# Patient Record
Sex: Male | Born: 1975
Health system: Southern US, Community
[De-identification: ages and names within clinical notes are randomized; demographics above are authoritative.]

## PROBLEM LIST (undated history)

## (undated) DIAGNOSIS — K219 Gastro-esophageal reflux disease without esophagitis: Secondary | ICD-10-CM

## (undated) DIAGNOSIS — R011 Cardiac murmur, unspecified: Secondary | ICD-10-CM

## (undated) HISTORY — DX: Gastro-esophageal reflux disease without esophagitis: K21.9

## (undated) HISTORY — DX: Cardiac murmur, unspecified: R01.1

---
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
## 2014-02-24 ENCOUNTER — Ambulatory Visit (INDEPENDENT_AMBULATORY_CARE_PROVIDER_SITE_OTHER): Payer: BC Managed Care – PPO | Admitting: Emergency Medicine

## 2014-02-24 VITALS — BP 128/98 | HR 77 | Temp 98.3°F | Resp 18 | Wt 156.0 lb

## 2014-02-24 DIAGNOSIS — B353 Tinea pedis: Secondary | ICD-10-CM

## 2014-02-24 MED ORDER — TERBINAFINE HCL 250 MG PO TABS: 250.0000 mg | ORAL_TABLET | Freq: Every day | ORAL | Status: DC

## 2014-02-24 NOTE — Progress Notes (Signed)
[facility] [address], [place] [address] [phone]  Date:  02/24/2014   Name:  Nathan PimpleSteven Mccall   DOB:  Jan 28, 1976   MRN:  [id number]  PCP:  No primary provider on file.    Chief Complaint: Tinea Pedis   History of Present Illness:  Nathan Mccall is a 38 y.o. very pleasant male patient who presents with the following:  Has red itchy eruption on both feet and ankles.  History of athletes foot.  Wears coots with cotton socks.  No improvement with over the counter medications or other home remedies. Denies other complaint or health concern today.  There are no active problems to display for this patient.   No past medical history on file.  No past surgical history on file.  History  Substance Use Topics  . Smoking status: Former Games developermoker  . Smokeless tobacco: Not on file  . Alcohol Use: Yes    Family History  Problem Relation Age of Onset  . Diabetes Mother     No Known Allergies  Medication list has been reviewed and updated.  No current outpatient prescriptions on file prior to visit.   No current facility-administered medications on file prior to visit.    Review of Systems:  As per HPI, otherwise negative.    Physical Examination: Filed Vitals:   02/24/14 1920  BP: 128/98  Pulse: 77  Temp: 98.3 F (36.8 C)  Resp: 18   Filed Vitals:   02/24/14 1920  Weight: 156 lb (70.761 kg)   There is no height on file to calculate BMI. Ideal Body Weight:     GEN: WDWN, NAD, Non-toxic, Alert & Oriented x 3 HEENT: Atraumatic, Normocephalic.  Ears and Nose: No external deformity. EXTR: No clubbing/cyanosis/edema NEURO: Normal gait.  PSYCH: Normally interactive. Conversant. Not depressed or anxious appearing.  Calm demeanor.  FEET:  Erythematous roundish smooth border lesions on feet and toes  Assessment and Plan: Tinea pedis Powder No cotton socks lamisil  Signed,  Phillips OdorJeffery Ayesha Markwell, MD

## 2014-02-24 NOTE — Patient Instructions (Signed)

## 2014-03-08 ENCOUNTER — Other Ambulatory Visit: Payer: Self-pay | Admitting: Emergency Medicine

## 2014-03-08 NOTE — Telephone Encounter (Signed)
Do you want to RF this? 

## 2015-03-13 ENCOUNTER — Ambulatory Visit (INDEPENDENT_AMBULATORY_CARE_PROVIDER_SITE_OTHER): Payer: 59 | Admitting: Emergency Medicine

## 2015-03-13 ENCOUNTER — Ambulatory Visit (INDEPENDENT_AMBULATORY_CARE_PROVIDER_SITE_OTHER): Payer: 59

## 2015-03-13 VITALS — BP 124/76 | HR 81 | Temp 98.0°F | Resp 17 | Ht 69.0 in | Wt 160.0 lb

## 2015-03-13 DIAGNOSIS — J341 Cyst and mucocele of nose and nasal sinus: Secondary | ICD-10-CM | POA: Diagnosis not present

## 2015-03-13 DIAGNOSIS — R1013 Epigastric pain: Secondary | ICD-10-CM

## 2015-03-13 LAB — COMPLETE METABOLIC PANEL WITH GFR
ALBUMIN: 4.7 g/dL (ref 3.5–5.2)
ALT: 29 U/L (ref 0–53)
AST: 18 U/L (ref 0–37)
Alkaline Phosphatase: 70 U/L (ref 39–117)
BUN: 9 mg/dL (ref 6–23)
CHLORIDE: 106 meq/L (ref 96–112)
CO2: 26 meq/L (ref 19–32)
Calcium: 9.5 mg/dL (ref 8.4–10.5)
Creat: 0.74 mg/dL (ref 0.50–1.35)
GFR, Est African American: >89 mL/min
Glucose, Bld: 91 mg/dL (ref 70–99)
Potassium: 4.4 mEq/L (ref 3.5–5.3)
Sodium: 140 mEq/L (ref 135–145)
Total Bilirubin: 0.6 mg/dL (ref 0.2–1.2)
Total Protein: 6.8 g/dL (ref 6.0–8.3)

## 2015-03-13 LAB — POCT CBC
GRANULOCYTE PERCENT: 60.5 % (ref 37–80)
HCT, POC: 47.2 % (ref 43.5–53.7)
Hemoglobin: 16.2 g/dL (ref 14.1–18.1)
Lymph, poc: 2.6 (ref 0.6–3.4)
MCH, POC: 30.6 pg (ref 27–31.2)
MCHC: 34.3 g/dL (ref 31.8–35.4)
MCV: 89.2 fL (ref 80–97)
MID (CBC): 0.5 (ref 0–0.9)
MPV: 7.8 fL (ref 0–99.8)
PLATELET COUNT, POC: 293 10*3/uL (ref 142–424)
POC Granulocyte: 4.8 (ref 2–6.9)
POC LYMPH PERCENT: 32.8 %L (ref 10–50)
POC MID %: 6.7 %M (ref 0–12)
RBC: 5.29 M/uL (ref 4.69–6.13)
RDW, POC: 13.8 %
WBC: 8 10*3/uL (ref 4.6–10.2)

## 2015-03-13 MED ORDER — OMEPRAZOLE 40 MG PO CPDR: 40.0000 mg | DELAYED_RELEASE_CAPSULE | Freq: Every day | ORAL | Status: DC

## 2015-03-13 MED ORDER — FLUTICASONE PROPIONATE 50 MCG/ACT NA SUSP: 2.0000 | Freq: Every day | NASAL | Status: DC

## 2015-03-13 NOTE — Patient Instructions (Signed)
Gastroesophageal Reflux Disease, Adult Gastroesophageal reflux disease (GERD) happens when acid from your stomach flows up into the esophagus. When acid comes in contact with the esophagus, the acid causes soreness (inflammation) in the esophagus. Over time, GERD may create small holes (ulcers) in the lining of the esophagus. CAUSES   Increased body weight. This puts pressure on the stomach, making acid rise from the stomach into the esophagus.  Smoking. This increases acid production in the stomach.  Drinking alcohol. This causes decreased pressure in the lower esophageal sphincter (valve or ring of muscle between the esophagus and stomach), allowing acid from the stomach into the esophagus.  Late evening meals and a full stomach. This increases pressure and acid production in the stomach.  A malformed lower esophageal sphincter. Sometimes, no cause is found. SYMPTOMS   Burning pain in the lower part of the mid-chest behind the breastbone and in the mid-stomach area. This may occur twice a week or more often.  Trouble swallowing.  Sore throat.  Dry cough.  Asthma-like symptoms including chest tightness, shortness of breath, or wheezing. DIAGNOSIS  Your caregiver may be able to diagnose GERD based on your symptoms. In some cases, X-rays and other tests may be done to check for complications or to check the condition of your stomach and esophagus. TREATMENT  Your caregiver may recommend over-the-counter or prescription medicines to help decrease acid production. Ask your caregiver before starting or adding any new medicines.  HOME CARE INSTRUCTIONS   Change the factors that you can control. Ask your caregiver for guidance concerning weight loss, quitting smoking, and alcohol consumption.  Avoid foods and drinks that make your symptoms worse, such as:  Caffeine or alcoholic drinks.  Chocolate.  Peppermint or mint flavorings.  Garlic and onions.  Spicy foods.  Citrus fruits,  such as oranges, lemons, or limes.  Tomato-based foods such as sauce, chili, salsa, and pizza.  Fried and fatty foods.  Avoid lying down for the 3 hours prior to your bedtime or prior to taking a nap.  Eat small, frequent meals instead of large meals.  Wear loose-fitting clothing. Do not wear anything tight around your waist that causes pressure on your stomach.  Raise the head of your bed 6 to 8 inches with wood blocks to help you sleep. Extra pillows will not help.  Only take over-the-counter or prescription medicines for pain, discomfort, or fever as directed by your caregiver.  Do not take aspirin, ibuprofen, or other nonsteroidal anti-inflammatory drugs (NSAIDs). SEEK IMMEDIATE MEDICAL CARE IF:   You have pain in your arms, neck, jaw, teeth, or back.  Your pain increases or changes in intensity or duration.  You develop nausea, vomiting, or sweating (diaphoresis).  You develop shortness of breath, or you faint.  Your vomit is green, yellow, black, or looks like coffee grounds or blood.  Your stool is red, bloody, or black. These symptoms could be signs of other problems, such as heart disease, gastric bleeding, or esophageal bleeding. MAKE SURE YOU:   Understand these instructions.  Will watch your condition.  Will get help right away if you are not doing well or get worse. Document Released: 05/16/2005 Document Revised: 10/29/2011 Document Reviewed: 02/23/2011 ExitCare Patient Information 2015 [employer]. This information is not intended to replace advice given to you by your health care provider. Make sure you discuss any questions you have with your health care provider.  

## 2015-03-13 NOTE — Progress Notes (Addendum)
Patient ID: Nathan Mccall, male   DOB: Jul 26, 1976, 39 y.o.   MRN: [id number]    This chart was scribed for Nena Jordan, MD by [facility], medical scribe at [facility].The patient was seen in exam room 10 and the patient's care was started at 10:25 AM.  Chief Complaint:  Chief Complaint  Patient presents with  . cyst in nose per patient  . heartburn   HPI: Nathan Mccall is a 39 y.o. male who reports to [facility] today complaining of a possible cyst in his right nares. He first noticed this three months. There is no pain, tenderness, or discomfort associated with the cyst.  He also complains of constant heartburn. He takes OTC nexium, zantac daily which does help. Taking these medications daily. Unsure if its related to what he eats.  He is a Dealer.  No past medical history on file. No past surgical history on file. History   Social History  . Marital Status: Single    Spouse Name: N/A  . Number of Children: N/A  . Years of Education: N/A   Social History Main Topics  . Smoking status: Former Research scientist (life sciences)  . Smokeless tobacco: Not on file  . Alcohol Use: Yes  . Drug Use: No  . Sexual Activity: Not on file   Other Topics Concern  . None   Social History Narrative   Family History  Problem Relation Age of Onset  . Diabetes Mother    No Known Allergies Prior to Admission medications   Medication Sig Start Date End Date Taking? Authorizing Provider  terbinafine (LAMISIL) 250 MG tablet TAKE 1 TABLET (250 MG TOTAL) BY MOUTH DAILY. Patient not taking: Reported on 03/13/2015 03/08/14   Roselee Culver, MD   ROS: The patient denies fevers, chills, night sweats, unintentional weight loss, chest pain, palpitations, wheezing, dyspnea on exertion, nausea, vomiting, abdominal pain, dysuria, hematuria, melena, numbness, weakness, or tingling.   All other systems have been reviewed and were otherwise negative with the exception of those mentioned in the HPI and  as above.    PHYSICAL EXAM: Filed Vitals:   03/13/15 0945  BP: 124/76  Pulse: 81  Temp: 98 F (36.7 C)  Resp: 17   Body mass index is 23.62 kg/(m^2).  General: Alert, no acute distress HEENT:  Normocephalic, atraumatic, oropharynx patent. Ears a normal, left nares is normal. Cystic area in the right nasal pharynx not typical of a nasal polyp. Psychiatric: Patient acts appropriately throughout our interaction.  LABS: No results found for this or any previous visit.  EKG/XRAY:   Primary read interpreted by Dr. Everlene Farrier at [facility]. No abnormality seen.  ASSESSMENT/PLAN: Patient placed on omeprazole 40 for 1 month and recheck for his dyspepsia. I did do before meals met. He will be on Flonase and referral made to ENT for their evaluation.I personally performed the services described in this documentation, which was scribed in my presence. The recorded information has been reviewed and is accurate.  Nena Jordan, MD  Gross sideeffects, risk and benefits, and alternatives of medications d/w patient. Patient is aware that all medications have potential sideeffects and we are unable to predict every sideeffect or drug-drug interaction that may occur.    Arlyss Queen MD 03/13/2015 10:25 AM

## 2015-05-02 ENCOUNTER — Ambulatory Visit (INDEPENDENT_AMBULATORY_CARE_PROVIDER_SITE_OTHER): Payer: 59 | Admitting: Emergency Medicine

## 2015-05-02 VITALS — BP 108/68 | HR 52 | Temp 98.0°F | Resp 16 | Ht 66.0 in | Wt 162.0 lb

## 2015-05-02 DIAGNOSIS — S161XXA Strain of muscle, fascia and tendon at neck level, initial encounter: Secondary | ICD-10-CM | POA: Diagnosis not present

## 2015-05-02 MED ORDER — TRAMADOL HCL 50 MG PO TABS: 50.0000 mg | ORAL_TABLET | Freq: Three times a day (TID) | ORAL | Status: DC | PRN

## 2015-05-02 MED ORDER — CYCLOBENZAPRINE HCL 10 MG PO TABS: 10.0000 mg | ORAL_TABLET | Freq: Three times a day (TID) | ORAL | Status: DC | PRN

## 2015-05-02 MED ORDER — NAPROXEN SODIUM 550 MG PO TABS: 550.0000 mg | ORAL_TABLET | Freq: Two times a day (BID) | ORAL | Status: AC

## 2015-05-02 NOTE — Patient Instructions (Signed)

## 2015-05-02 NOTE — Progress Notes (Signed)
Subjective:  Patient ID: Nathan Mccall, male    DOB: [birth year]  Age: 39 y.o. MRN: [id number]  CC: Neck Pain and Does not want flu shot   HPI Nathan Mccall presents  with pain in his base of his right neck after lifting a 50 pound bag of corn yesterday. No radicular symptoms. No radiation of pain numbness tingling or weakness. He has no direct injury. He has a history of prior cervical strain. He's had no improvement is pain with over-the-counter medication or local application of heat  History Nathan Mccall has no past medical history on file.   He has no past surgical history on file.   His  family history includes Diabetes in his mother.  He   reports that he has quit smoking. He does not have any smokeless tobacco history on file. He reports that he drinks alcohol. He reports that he does not use illicit drugs.  Outpatient Prescriptions Prior to Visit  Medication Sig Dispense Refill  . omeprazole (PRILOSEC) 40 MG capsule Take 1 capsule (40 mg total) by mouth daily. 30 capsule 1  . fluticasone (FLONASE) 50 MCG/ACT nasal spray Place 2 sprays into both nostrils daily. (Patient not taking: Reported on 05/02/2015) 16 g 6  . terbinafine (LAMISIL) 250 MG tablet TAKE 1 TABLET (250 MG TOTAL) BY MOUTH DAILY. (Patient not taking: Reported on 03/13/2015) 15 tablet 0   No facility-administered medications prior to visit.    Social History   Social History  . Marital Status: Single    Spouse Name: N/A  . Number of Children: N/A  . Years of Education: N/A   Social History Main Topics  . Smoking status: Former Games developer  . Smokeless tobacco: None  . Alcohol Use: Yes  . Drug Use: No  . Sexual Activity: Not Asked   Other Topics Concern  . None   Social History Narrative     Review of Systems  Constitutional: Negative for fever, chills and appetite change.  HENT: Negative for congestion, ear pain, postnasal drip, sinus pressure and sore throat.   Eyes: Negative for pain and  redness.  Respiratory: Negative for cough, shortness of breath and wheezing.   Cardiovascular: Negative for leg swelling.  Gastrointestinal: Negative for nausea, vomiting, abdominal pain, diarrhea, constipation and blood in stool.  Endocrine: Negative for polyuria.  Genitourinary: Negative for dysuria, urgency, frequency and flank pain.  Musculoskeletal: Negative for gait problem.  Skin: Negative for rash.  Neurological: Negative for weakness and headaches.  Psychiatric/Behavioral: Negative for confusion and decreased concentration. The patient is not nervous/anxious.     Objective:  BP 108/68 mmHg  Pulse 52  Temp(Src) 98 F (36.7 C) (Oral)  Resp 16  Ht  (1.676 m)  Wt 162 lb (73.483 kg)  BMI 26.16 kg/m2  SpO2 98%  Physical Exam  Constitutional: He is oriented to person, place, and time. He appears well-developed and well-nourished.  HENT:  Head: Normocephalic and atraumatic.  Eyes: Conjunctivae are normal. Pupils are equal, round, and reactive to light.  Pulmonary/Chest: Effort normal.  Musculoskeletal: He exhibits no edema.       Right shoulder: He exhibits tenderness and spasm.       Cervical back: He exhibits decreased range of motion and tenderness.  Neurological: He is alert and oriented to person, place, and time.  Skin: Skin is dry.  Psychiatric: He has a normal mood and affect. His behavior is normal. Thought content normal.      Assessment & Plan:  Nathan Mccall was seen today for neck pain and does not want flu shot.  Diagnoses and all orders for this visit:  Cervical strain, initial encounter  Other orders -     naproxen sodium (ANAPROX DS) 550 MG tablet; Take 1 tablet (550 mg total) by mouth 2 (two) times daily with a meal. -     cyclobenzaprine (FLEXERIL) 10 MG tablet; Take 1 tablet (10 mg total) by mouth 3 (three) times daily as needed for muscle spasms. -     traMADol (ULTRAM) 50 MG tablet; Take 1 tablet (50 mg total) by mouth every 8 (eight) hours as  needed.   I have discontinued Mr. Szczepanik terbinafine and fluticasone. I am also having him start on naproxen sodium, cyclobenzaprine, and traMADol. Additionally, I am having him maintain his omeprazole and ibuprofen.  Meds ordered this encounter  Medications  . ibuprofen (ADVIL,MOTRIN) 200 MG tablet    Sig: Take 200 mg by mouth every 6 (six) hours as needed for moderate pain.  . naproxen sodium (ANAPROX DS) 550 MG tablet    Sig: Take 1 tablet (550 mg total) by mouth 2 (two) times daily with a meal.    Dispense:  40 tablet    Refill:  0  . cyclobenzaprine (FLEXERIL) 10 MG tablet    Sig: Take 1 tablet (10 mg total) by mouth 3 (three) times daily as needed for muscle spasms.    Dispense:  30 tablet    Refill:  0  . traMADol (ULTRAM) 50 MG tablet    Sig: Take 1 tablet (50 mg total) by mouth every 8 (eight) hours as needed.    Dispense:  45 tablet    Refill:  0    Appropriate red flag conditions were discussed with the patient as well as actions that should be taken.  Patient expressed his understanding.  Follow-up: Return if symptoms worsen or fail to improve.  Carmelina Dane, MD

## 2015-05-26 ENCOUNTER — Other Ambulatory Visit: Payer: Self-pay | Admitting: Emergency Medicine

## 2015-11-03 ENCOUNTER — Other Ambulatory Visit: Payer: Self-pay | Admitting: Emergency Medicine

## 2016-01-06 ENCOUNTER — Other Ambulatory Visit: Payer: Self-pay | Admitting: Emergency Medicine

## 2016-04-12 ENCOUNTER — Ambulatory Visit: Payer: Self-pay | Admitting: Internal Medicine

## 2016-06-16 IMAGING — CR DG SINUSES COMPLETE 3+V
5 series · 5 of 5 positions shown · non-contrast
Comparison: None.

CLINICAL DATA: Cyst of right nasal cavity.

EXAM:
PARANASAL SINUSES - COMPLETE 3 + VIEW

[PA]
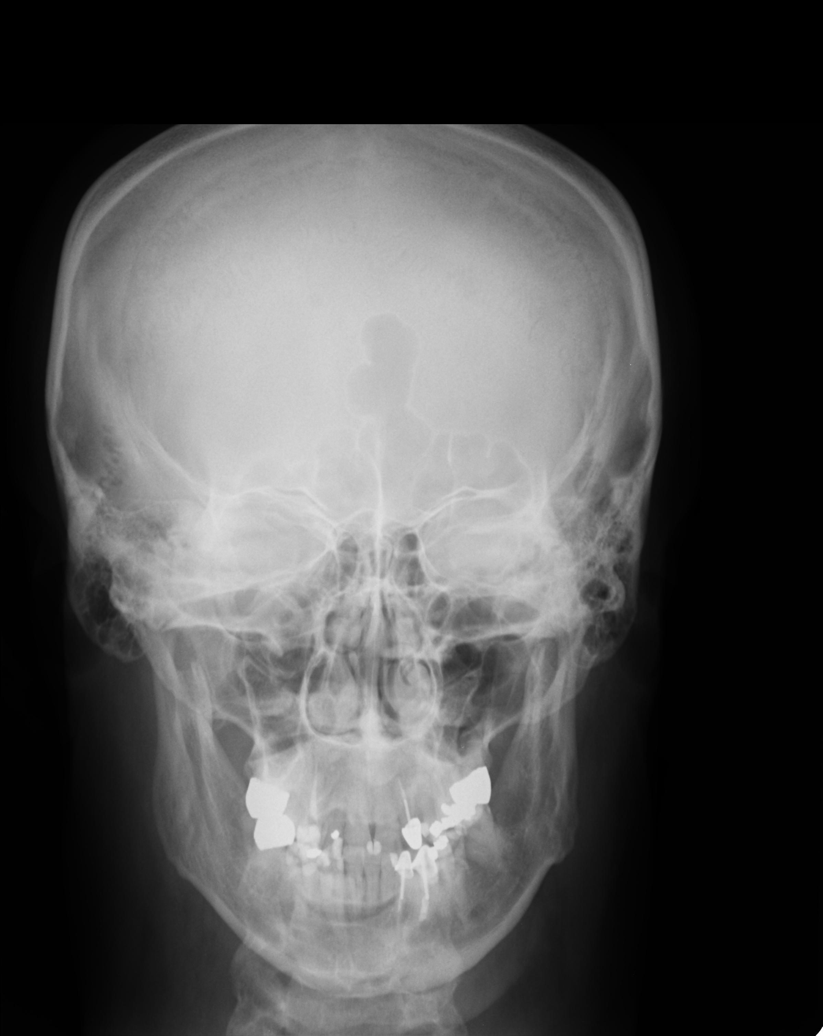

[waters]
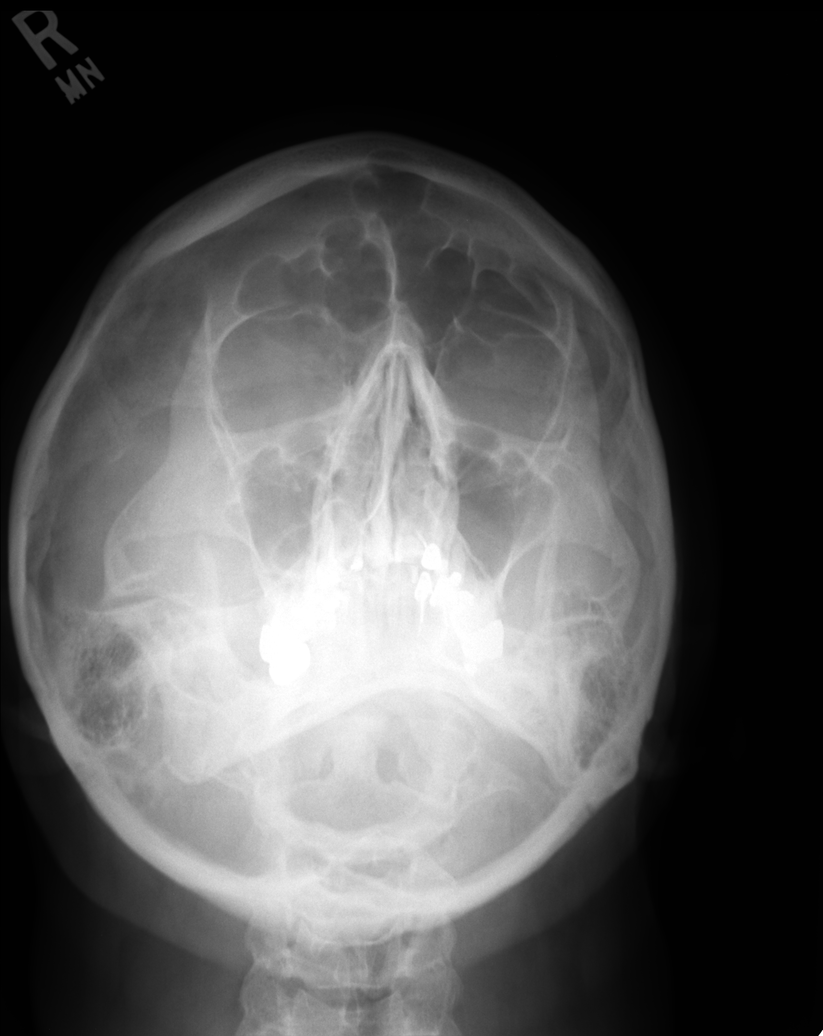

[lateral]
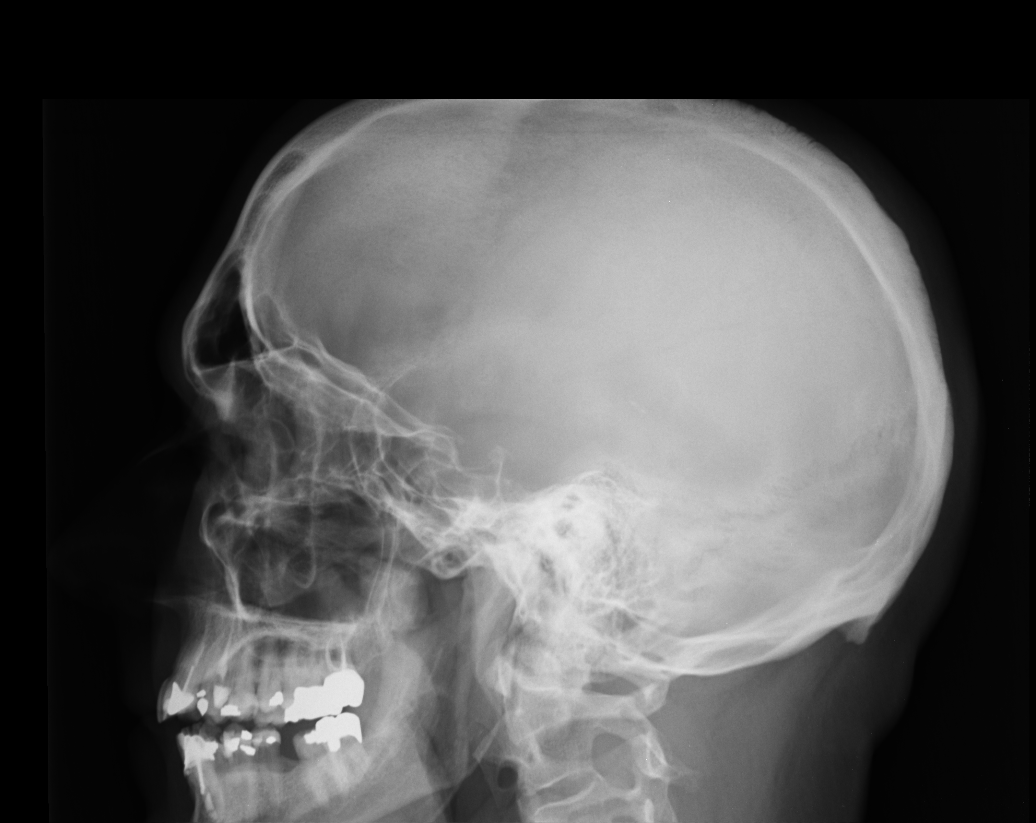

[smv (1 of 2)]
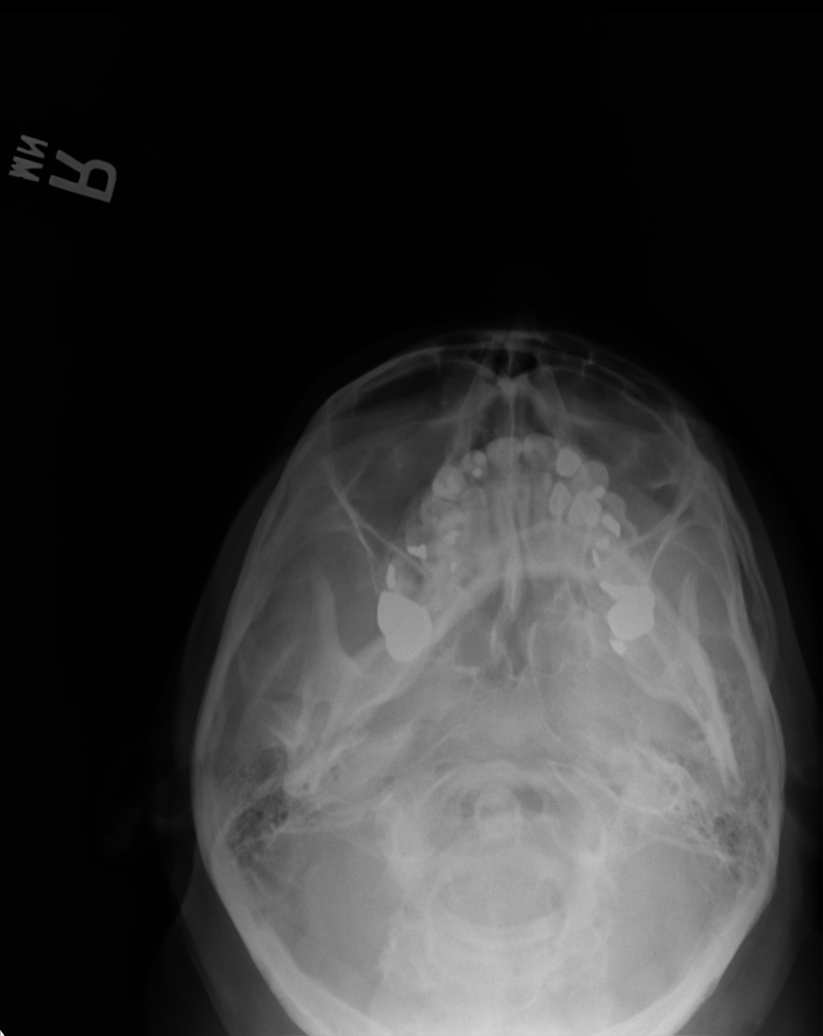

[smv (2 of 2)]
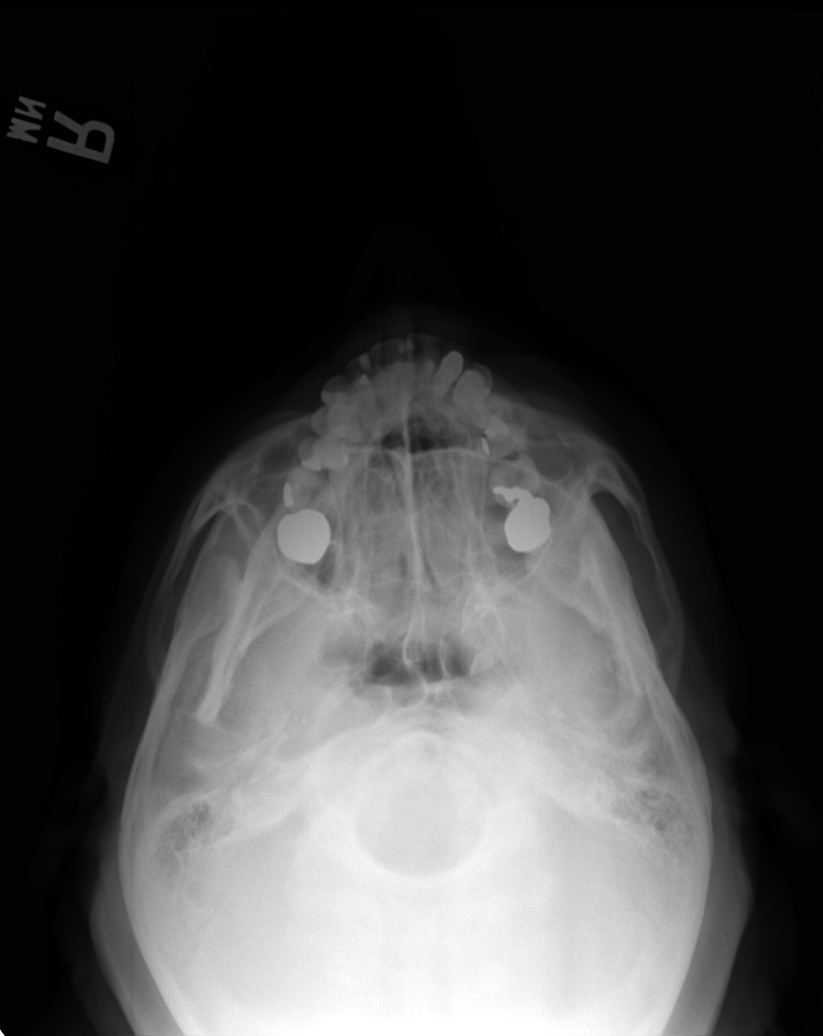

[5 of 5 positions shown; findings below may reference images not displayed]

FINDINGS: The paranasal sinus are aerated. There is no evidence of sinus
opacification air-fluid levels or mucosal thickening. No significant
bone abnormalities are seen.
IMPRESSION: Negative.

## 2016-08-04 DIAGNOSIS — H02843 Edema of right eye, unspecified eyelid: Secondary | ICD-10-CM | POA: Diagnosis not present

## 2016-08-04 DIAGNOSIS — H109 Unspecified conjunctivitis: Secondary | ICD-10-CM | POA: Diagnosis not present

## 2017-01-03 ENCOUNTER — Encounter: Payer: Self-pay | Admitting: Urology

## 2017-01-03 ENCOUNTER — Ambulatory Visit: Payer: BLUE CROSS/BLUE SHIELD | Admitting: Urology

## 2017-01-03 VITALS — BP 132/78 | HR 99 | Ht 70.0 in | Wt 155.0 lb

## 2017-01-03 DIAGNOSIS — Z3009 Encounter for other general counseling and advice on contraception: Secondary | ICD-10-CM | POA: Diagnosis not present

## 2017-01-03 MED ORDER — DIAZEPAM 10 MG PO TABS: 10.0000 mg | ORAL_TABLET | Freq: Once | ORAL | 0 refills | Status: AC

## 2017-01-03 NOTE — Progress Notes (Signed)
01/03/2017 10:54 AM   Nathan Mccall 16-Aug-1976 [id number]  Referring provider: Peyton Najjar, MD [address] [place], [place] [address]  CC: Vasectomy consult  HPI: The patient is a 41 year old gentleman who presents today for discussion regarding a vasectomy. The patient has 3 children. He and his wife desire no further children. He has no genitourinary problems. He has no voiding dysfunction. Denies history of hematuria, UTI, nephrolithiasis.     PMH: Past Medical History:  Diagnosis Date  . GERD (gastroesophageal reflux disease)   . Heart murmur     Surgical History: Past Surgical History:  Procedure Laterality Date  . APPENDECTOMY  1987    Home Medications:  Allergies as of 01/03/2017   No Known Allergies     Medication List       Accurate as of 01/03/17 10:54 AM. Always use your most recent med list.          diazepam 10 MG tablet Commonly known as:  VALIUM Take 1 tablet (10 mg total) by mouth once.       Allergies: No Known Allergies  Family History: Family History  Problem Relation Age of Onset  . Diabetes Mother     Social History:  reports that he has quit smoking. He has never used smokeless tobacco. He reports that he drinks alcohol. He reports that he does not use drugs.  ROS: UROLOGY Frequent Urination?: No Hard to postpone urination?: No Burning/pain with urination?: No Get up at night to urinate?: No Leakage of urine?: No Urine stream starts and stops?: No Trouble starting stream?: No Do you have to strain to urinate?: No Blood in urine?: No Urinary tract infection?: No Sexually transmitted disease?: No Injury to kidneys or bladder?: No Painful intercourse?: No Weak stream?: No Erection problems?: No Penile pain?: No  Gastrointestinal Nausea?: No Vomiting?: No Indigestion/heartburn?: Yes Diarrhea?: No Constipation?: No  Constitutional Fever: No Night sweats?: No Weight loss?: No Fatigue?: No  Skin Skin  rash/lesions?: No Itching?: No  Eyes Blurred vision?: No Double vision?: No  Ears/Nose/Throat Sore throat?: No Sinus problems?: No  Hematologic/Lymphatic Swollen glands?: No Easy bruising?: No  Cardiovascular Leg swelling?: No Chest pain?: No  Respiratory Cough?: No Shortness of breath?: No  Endocrine Excessive thirst?: No  Musculoskeletal Back pain?: No Joint pain?: No  Neurological Headaches?: No Dizziness?: No  Psychologic Depression?: No Anxiety?: No  Physical Exam: BP 132/78   Pulse 99   Ht 5\' 10"  (1.778 m)   Wt 155 lb (70.3 kg)   BMI 22.24 kg/m   Constitutional:  Alert and oriented, No acute distress. HEENT: [place] AT, moist mucus membranes.  Trachea midline, no masses. Cardiovascular: No clubbing, cyanosis, or edema. Respiratory: Normal respiratory effort, no increased work of breathing. GI: Abdomen is soft, nontender, nondistended, no abdominal masses GU: No CVA tenderness. Normal phallus. Testicles descended bilaterally. No masses. Bilateral vas deferens palpable easily bilaterally. Skin: No rashes, bruises or suspicious lesions. Lymph: No cervical or inguinal adenopathy. Neurologic: Grossly intact, no focal deficits, moving all 4 extremities. Psychiatric: Normal mood and affect.  Laboratory Data: Lab Results  Component Value Date   WBC 8.0 03/13/2015   HGB 16.2 03/13/2015   HCT 47.2 03/13/2015   MCV 89.2 03/13/2015    Lab Results  Component Value Date   CREATININE 0.74 03/13/2015    No results found for: PSA  No results found for: TESTOSTERONE  No results found for: HGBA1C  Urinalysis No results found for: COLORURINE, APPEARANCEUR, LABSPEC, PHURINE,  GLUCOSEU, HGBUR, BILIRUBINUR, KETONESUR, PROTEINUR, UROBILINOGEN, NITRITE, LEUKOCYTESUR   Assessment & Plan:    Today, we discussed what the vas deferens is, where it is located, and its function. We reviewed the procedure for vasectomy, it's risks, benefits, alternatives, and  likelihood of achieving his goals. We discussed in detail the procedure, complications, and recovery as well as the need for clearance prior to unprotected intercourse. We discussed that vasectomy does not protect against sexually transmitted diseases. We discussed that this procedure does not result in immediate sterility and that they would need to use other forms of birth control until he has been cleared with negative postvasectomy semen analyses. I explained that the procedure is considered to be permanent and that attempts at reversal have varying degrees of success. These options include vasectomy reversal, sperm retrieval, and in vitro fertilization; these can be very expensive. We discussed the chance of postvasectomy pain syndrome which occurs in less than 5% of patients. I explained to the patient that there is no treatment to resolve this chronic pain, and that if it developed I would not be able to help resolve the issue, but that surgery is generally not needed for correction. I explained there have even been reports of systemic like illness associated with this chronic pain, and that there was no good cure. I explained that vasectomy it is not a 100% reliable form of birth control, and the risk of pregnancy after vasectomy is approximately 1 in 2000 men who had a negative postvasectomy semen analysis or rare non-motile sperm. I explained that repeat vasectomy was necessary in less than 1% of vasectomy procedures when employing the type of technique that I use. I explained that he should refrain from ejaculation for approximately one week following vasectomy. I explained that there are other options for birth control which are permanent and non-permanent; we discussed these. I explained the rates of surgical complications, such as symptomatic hematoma or infection, are low (1-2%) and vary with the surgeon's experience and criteria used to diagnose the complication.  The patient had the opportunity to  ask questions to his stated satisfaction. He voiced understanding of the above factors and stated that he has read all the information provided to him and the packets and informed consent  1. Family planning -vasecomty  Return for vasectomy.  Hildred LaserBrian James Derin Matthes, MD  [facility] [address], [address] [place], [place] [address] [phone]

## 2017-02-07 ENCOUNTER — Encounter: Payer: Self-pay | Admitting: Urology

## 2017-02-07 ENCOUNTER — Ambulatory Visit: Payer: BLUE CROSS/BLUE SHIELD | Admitting: Urology

## 2017-02-07 VITALS — BP 102/63 | HR 64 | Ht 70.0 in | Wt 150.7 lb

## 2017-02-07 DIAGNOSIS — Z3009 Encounter for other general counseling and advice on contraception: Secondary | ICD-10-CM

## 2017-02-07 MED ORDER — OXYCODONE-ACETAMINOPHEN 5-325 MG PO TABS: 1.0000 | ORAL_TABLET | ORAL | 0 refills | Status: DC | PRN

## 2017-02-07 NOTE — Progress Notes (Signed)
Bilateral Vasectomy Procedure  Pre-Procedure: - Patient's scrotum was prepped and draped for vasectomy. - The vas was palpated through the scrotal skin on the left. - 1% Xylocaine was injected into the skin and surrounding tissue for placement  - In a similar manner, the vas on the right was identified, anesthetized, and stabilized.  Procedure: - A scalpel was used to make a 1 cm incision in his left hemiscrotum - The left vas was isolated and brought up through the incision exposing that structure. - Bleeding points were cauterized as they occurred. - The vas was free from the surrounding structures and brought into view. - A segment was positioned for placement with a hemostat. - A second hemostat was placed and a small segment between the two hemostats and was removed for inspection. - Each end of the transected vas lumen was fulgurated/obliterated using needlepoint electrocautery. It was then tied off with a #2-0 silk suture -The same procedure was performed on the right. - A suture of #3-0 chromic catgut was used to close each lateral scrotal skin incision  Post-Procedure: - Patient was instructed in care of the operative area - A specimen is to be delivered in 12 and 16 weeks   -Another form of contraception is to be used until he is cleared by the office.   

## 2017-05-09 ENCOUNTER — Other Ambulatory Visit: Payer: BLUE CROSS/BLUE SHIELD

## 2017-05-09 DIAGNOSIS — Z9852 Vasectomy status: Secondary | ICD-10-CM

## 2017-05-11 LAB — POST-VAS SPERM EVALUATION,QUAL: Volume: 2.2 mL

## 2017-05-13 ENCOUNTER — Telehealth: Payer: Self-pay

## 2017-05-13 NOTE — Telephone Encounter (Signed)
Nathan Laser, MD  [name], LPN        Please let patient know sperm in semen analysis. Needs one more in one month to be cleared.    Spoke with pt in reference to semen analysis results and needing another one in a month. Pt voiced understanding.

## 2017-05-27 ENCOUNTER — Telehealth: Payer: Self-pay

## 2017-05-27 NOTE — Telephone Encounter (Signed)
Pt called stating he saw on mychart where his semen analysis came back clear. Reinforced with pt how the semen analysis are read and the purpose of needing another one. Pt voiced understanding stating he will come back in a couple of weeks.

## 2017-06-14 ENCOUNTER — Other Ambulatory Visit: Payer: BLUE CROSS/BLUE SHIELD

## 2017-06-14 ENCOUNTER — Other Ambulatory Visit: Payer: Self-pay | Admitting: *Deleted

## 2017-06-14 DIAGNOSIS — Z9852 Vasectomy status: Secondary | ICD-10-CM

## 2017-06-15 LAB — POST-VAS SPERM EVALUATION,QUAL: Volume: 1.8 mL

## 2017-06-20 ENCOUNTER — Telehealth: Payer: Self-pay | Admitting: Urology

## 2017-06-20 NOTE — Telephone Encounter (Signed)
Pt called office stating his mychart has alerted him that he has semen results.  Pt asking that someone call him about this as he is very concerned. Please advise. Thanks.

## 2017-06-21 NOTE — Telephone Encounter (Signed)
Spoke with pt in reference to second semen analysis. Pt voiced frustration about an original analysis being negative and the second being positive. Please advise.

## 2017-06-21 NOTE — Telephone Encounter (Signed)
[name], [name], MD  [name], [name], LPN        Can you let patient know that his second semen analysis was read as sperm present. This is more than likely a false positive since his first semen analysis had no sperm. Can you set him up with a formal sperm analysis to be sure? Thanks    Spoke with pt in reference to needing a formal analysis. Pt voiced understanding. Pt will RTC next week to pick up paperwork.

## 2017-07-09 ENCOUNTER — Other Ambulatory Visit: Payer: Self-pay | Admitting: Urology

## 2017-07-09 LAB — SEMEN ANALYSIS, BASIC

## 2017-07-12 LAB — POSTVASECTOMY SEMEN ANALYSIS: SEMEN VOLUME: 2.8 mL

## 2017-07-18 ENCOUNTER — Telehealth: Payer: Self-pay

## 2017-07-18 NOTE — Telephone Encounter (Signed)
Pt called stating he got his formal semen analysis results on mychart. Pt was wanting the official results to be cleared. Please advise.

## 2017-07-19 NOTE — Telephone Encounter (Signed)
Nathan Mccall, Nathan James, Nathan Mccall  Nathan Mccall, Nathan Mccormick C, LPN        Please let patient know that semen analysis shows no sperm. He can stop using birth control.    Spoke with pt in reference to semen analysis results. Pt voiced understanding.

## 2017-12-02 ENCOUNTER — Ambulatory Visit: Payer: BLUE CROSS/BLUE SHIELD | Admitting: Physician Assistant

## 2017-12-02 ENCOUNTER — Encounter: Payer: Self-pay | Admitting: Physician Assistant

## 2017-12-02 ENCOUNTER — Other Ambulatory Visit: Payer: Self-pay

## 2017-12-02 VITALS — BP 108/70 | HR 86 | Temp 98.0°F | Resp 16 | Ht 70.08 in | Wt 151.8 lb

## 2017-12-02 DIAGNOSIS — J069 Acute upper respiratory infection, unspecified: Secondary | ICD-10-CM | POA: Diagnosis not present

## 2017-12-02 DIAGNOSIS — B9789 Other viral agents as the cause of diseases classified elsewhere: Secondary | ICD-10-CM

## 2017-12-02 NOTE — Patient Instructions (Addendum)
Get plenty of rest and drink lots of fluids.  Take 2 Aleve in the morning and 2 aleve at night. Do this for 3-5 days.     IF you received an x-ray today, you will receive an invoice from [facility]. Please contact [facility] at [phone] with questions or concerns regarding your invoice.   IF you received labwork today, you will receive an invoice from [facility]. Please contact [facility] at [phone] with questions or concerns regarding your invoice.   Our billing staff will not be able to assist you with questions regarding bills from these companies.  You will be contacted with the lab results as soon as they are available. The fastest way to get your results is to activate your My Chart account. Instructions are located on the last page of this paperwork. If you have not heard from us regarding the results in 2 weeks, please contact this office.

## 2017-12-02 NOTE — Progress Notes (Signed)
    12/02/2017 11:31 AM   DOB: [birth year] / MRN: [id number]  SUBJECTIVE:  Nathan Mccall C Wisnieski is a 42 y.o. male presenting for sore throat and cough for four days now.  Feels he is not better or worse.  Tried nyquil and dayguil. No SOB, chest pain, leg swelling.  Denies fever.   He has No Known Allergies.   He  has a past medical history of GERD (gastroesophageal reflux disease) and Heart murmur.    He  reports that he has quit smoking. He has never used smokeless tobacco. He reports that he drinks alcohol. He reports that he does not use drugs. He  has no sexual activity history on file. The patient  has a past surgical history that includes Appendectomy (1987).  His family history includes Diabetes in his mother.  ROS PER HPI  The problem list and medications were reviewed and updated by myself where necessary and exist elsewhere in the encounter.   OBJECTIVE:  BP 108/70   Pulse 86   Temp 98 F (36.7 C) (Oral)   Resp 16   Ht 5' 10.08" (1.78 m)   Wt 151 lb 12.8 oz (68.9 kg)   SpO2 97%   BMI 21.73 kg/m   Physical Exam  Constitutional: He is oriented to person, place, and time. He appears well-developed. He does not appear ill.  Eyes: Pupils are equal, round, and reactive to light. Conjunctivae and EOM are normal.  Cardiovascular: Normal rate.  Pulmonary/Chest: Effort normal.  Abdominal: He exhibits no distension.  Musculoskeletal: Normal range of motion.  Neurological: He is alert and oriented to person, place, and time. No cranial nerve deficit. Coordination normal.  Skin: Skin is warm and dry. He is not diaphoretic.  Psychiatric: He has a normal mood and affect.  Nursing note and vitals reviewed.   No results found for this or any previous visit (from the past 72 hour(s)).  No results found.  ASSESSMENT AND PLAN:  [name] was seen today for nasal congestion and cough.  Diagnoses and all orders for this visit:  Viral URI with cough: Advised aleve.  Patient on day 4  of illness.  If still sick on day 10 of illness advised he call the office and I will start an abx.     The patient is advised to call or return to clinic if he does not see an improvement in symptoms, or to seek the care of the closest emergency department if he worsens with the above plan.   Deliah BostonMichael Clark, MHS, PA-C Primary Care at [facility] 12/02/2017 11:31 AM

## 2017-12-12 ENCOUNTER — Other Ambulatory Visit: Payer: Self-pay | Admitting: Physician Assistant

## 2017-12-12 ENCOUNTER — Telehealth: Payer: Self-pay | Admitting: Physician Assistant

## 2017-12-12 MED ORDER — AMOXICILLIN 500 MG PO CAPS: 1000.0000 mg | ORAL_CAPSULE | Freq: Two times a day (BID) | ORAL | 0 refills | Status: DC

## 2017-12-12 NOTE — Telephone Encounter (Signed)
Please call patient.  I have sent in amoxicillin to the pharmacy.  If he continues to have sore throat after treatment I need to see him back in clinic. Nathan BostonMichael Dorathea Faerber, MS, PA-C 9:46 AM, 12/12/2017

## 2017-12-12 NOTE — Telephone Encounter (Signed)
Copied from CRM [id number]. Topic: Quick Communication - See Telephone Encounter >> Dec 12, 2017  9:23 AM Arlyss Gandyichardson, Jo Booze N, NT wrote: CRM for notification. See Telephone encounter for: 12/12/17. Pt states that he was seen about 10 days ago and saw Deliah BostonMichael Clark and was advised to call back after 10 days if he was still having a sore throat and a antibiotic could be called in. Pt is still experiencing a sore throat. [id number] Sheen[place], [place] - [address] [phone] (Phone) [phone] (Fax)

## 2017-12-12 NOTE — Telephone Encounter (Signed)
Informed pt of mediation sent to pharmacy for pickup, he verbalized understanding.

## 2017-12-12 NOTE — Telephone Encounter (Signed)
PEC message sent to Nathan BostonMichael Mccall

## 2018-01-18 ENCOUNTER — Encounter: Payer: Self-pay | Admitting: Physician Assistant

## 2018-01-18 ENCOUNTER — Ambulatory Visit (INDEPENDENT_AMBULATORY_CARE_PROVIDER_SITE_OTHER): Payer: BLUE CROSS/BLUE SHIELD | Admitting: Physician Assistant

## 2018-01-18 VITALS — BP 110/80 | HR 72 | Temp 97.8°F | Ht 70.0 in | Wt 150.8 lb

## 2018-01-18 DIAGNOSIS — Z1321 Encounter for screening for nutritional disorder: Secondary | ICD-10-CM | POA: Diagnosis not present

## 2018-01-18 DIAGNOSIS — Z1329 Encounter for screening for other suspected endocrine disorder: Secondary | ICD-10-CM

## 2018-01-18 DIAGNOSIS — Z130 Encounter for screening for diseases of the blood and blood-forming organs and certain disorders involving the immune mechanism: Secondary | ICD-10-CM

## 2018-01-18 DIAGNOSIS — Z113 Encounter for screening for infections with a predominantly sexual mode of transmission: Secondary | ICD-10-CM | POA: Diagnosis not present

## 2018-01-18 DIAGNOSIS — Z23 Encounter for immunization: Secondary | ICD-10-CM

## 2018-01-18 DIAGNOSIS — Z126 Encounter for screening for malignant neoplasm of bladder: Secondary | ICD-10-CM

## 2018-01-18 DIAGNOSIS — Z13228 Encounter for screening for other metabolic disorders: Secondary | ICD-10-CM

## 2018-01-18 DIAGNOSIS — Z114 Encounter for screening for human immunodeficiency virus [HIV]: Secondary | ICD-10-CM

## 2018-01-18 DIAGNOSIS — Z125 Encounter for screening for malignant neoplasm of prostate: Secondary | ICD-10-CM

## 2018-01-18 DIAGNOSIS — Z0000 Encounter for general adult medical examination without abnormal findings: Secondary | ICD-10-CM | POA: Diagnosis not present

## 2018-01-18 DIAGNOSIS — Z87891 Personal history of nicotine dependence: Secondary | ICD-10-CM | POA: Insufficient documentation

## 2018-01-18 DIAGNOSIS — Z13220 Encounter for screening for lipoid disorders: Secondary | ICD-10-CM

## 2018-01-18 LAB — POCT URINALYSIS DIP (MANUAL ENTRY)
Bilirubin, UA: NEGATIVE
Blood, UA: NEGATIVE
Glucose, UA: NEGATIVE mg/dL
Ketones, POC UA: NEGATIVE mg/dL
Leukocytes, UA: NEGATIVE
Nitrite, UA: NEGATIVE
PH UA: 7 (ref 5.0–8.0)
SPEC GRAV UA: 1.02 (ref 1.010–1.025)
UROBILINOGEN UA: 1 U/dL

## 2018-01-18 NOTE — Progress Notes (Signed)
01/18/2018 8:34 AM   DOB: [birth year] / MRN: [id number]  SUBJECTIVE:  Nathan Mccall is a 42 y.o. male presenting for annual physical exam.  Former smoker with a 20-pack-year history quit 3 years ago.  He continues to vape today.  No family history of colon cancer.  History of prostate cancer in family however no premature deaths from the disease.  Patient elects to avoid PSA screening given the inherent risk after some discussion.  Has a history of a heart murmur however denies swelling and chest pain as well as shortness of breath.  No history of hypertension.  He has No Known Allergies.   He  has a past medical history of GERD (gastroesophageal reflux disease) and Heart murmur.    He  reports that he has quit smoking. He has never used smokeless tobacco. He reports that he drinks about 3.6 oz of alcohol per week. He reports that he does not use drugs. He  reports that he currently engages in sexual activity. The patient  has a past surgical history that includes Appendectomy (1987).  His family history includes Cancer in his maternal grandmother and paternal grandmother; Diabetes in his maternal grandfather and mother.  Review of Systems  Constitutional: Negative for chills, diaphoresis and fever.  Eyes: Negative.   Respiratory: Negative for cough, hemoptysis, sputum production, shortness of breath and wheezing.   Cardiovascular: Negative for chest pain, orthopnea and leg swelling.  Gastrointestinal: Negative for abdominal pain, blood in stool, constipation, diarrhea, heartburn, melena, nausea and vomiting.  Genitourinary: Negative for dysuria, flank pain, frequency, hematuria and urgency.  Skin: Negative for rash.  Neurological: Negative for dizziness, sensory change, speech change, focal weakness and headaches.    The problem list and medications were reviewed and updated by myself where necessary and exist elsewhere in the encounter.   OBJECTIVE:  BP 110/80 (BP Location: Left  Arm, Patient Position: Sitting, Cuff Size: Normal)   Pulse 72   Temp 97.8 F (36.6 C) (Oral)   Ht 5\' 10"  (1.778 m)   Wt 150 lb 12.8 oz (68.4 kg)   SpO2 98%   BMI 21.64 kg/m   Physical Exam  Constitutional: He is oriented to person, place, and time. He appears well-developed. He is active and cooperative.  Non-toxic appearance. He does not appear ill.  HENT:  Right Ear: Hearing, tympanic membrane, external ear and ear canal normal.  Left Ear: Hearing, tympanic membrane, external ear and ear canal normal.  Nose: Nose normal. Right sinus exhibits no maxillary sinus tenderness and no frontal sinus tenderness. Left sinus exhibits no maxillary sinus tenderness and no frontal sinus tenderness.  Mouth/Throat: Uvula is midline, oropharynx is clear and moist and mucous membranes are normal. No oropharyngeal exudate, posterior oropharyngeal edema or tonsillar abscesses.  Eyes: Pupils are equal, round, and reactive to light. Conjunctivae and EOM are normal.  Cardiovascular: Normal rate, regular rhythm, S1 normal, S2 normal, normal heart sounds, intact distal pulses and normal pulses. Exam reveals no gallop and no friction rub.  No murmur heard. Pulmonary/Chest: Effort normal. No stridor. No tachypnea. No respiratory distress. He has no wheezes. He has no rales.  Abdominal: He exhibits no distension.  Musculoskeletal: Normal range of motion. He exhibits no edema.  Lymphadenopathy:       Head (right side): No submandibular and no tonsillar adenopathy present.       Head (left side): No submandibular and no tonsillar adenopathy present.    He has no cervical adenopathy.  Neurological:  He is alert and oriented to person, place, and time. He has normal strength and normal reflexes. He is not disoriented. No cranial nerve deficit or sensory deficit. He exhibits normal muscle tone. Coordination and gait normal.  Skin: Skin is warm and dry. He is not diaphoretic. No pallor.  Psychiatric: He has a normal  mood and affect. His behavior is normal.  Nursing note and vitals reviewed.   No results found for this or any previous visit (from the past 72 hour(s)).  No results found.  ASSESSMENT AND PLAN:  Nathan Mccall was seen today for annual exam.  Diagnoses and all orders for this visit:  Annual physical exam  Screening for endocrine, nutritional, metabolic and immunity disorder -     CBC -     Basic metabolic panel  Screening, lipid -     Lipid panel  Need for Tdap vaccination -     Tdap vaccine greater than or equal to 7yo IM  Screening for HIV (human immunodeficiency virus) -     HIV antibody  Bladder cancer screening -     POCT urinalysis dipstick  Screening examination for sexually transmitted disease -     GC/Chlamydia Probe Amp    The patient is advised to call or return to clinic if he does not see an improvement in symptoms, or to seek the care of the closest emergency department if he worsens with the above plan.   Nathan Mccall, MHS, PA-C Primary Care at [facility] 01/18/2018 8:34 AM

## 2018-01-18 NOTE — Patient Instructions (Addendum)
  I will be in touch with guard to lab work.  I expect everything will come back within normal limits.  Please come back and see us anytime that you have an acute illness, otherwise I like to see you back in about a year.   IF you received an x-ray today, you will receive an invoice from [facility]. Please contact [facility] at [phone] with questions or concerns regarding your invoice.   IF you received labwork today, you will receive an invoice from [facility]. Please contact [facility] at [phone] with questions or concerns regarding your invoice.   Our billing staff will not be able to assist you with questions regarding bills from these companies.  You will be contacted with the lab results as soon as they are available. The fastest way to get your results is to activate your My Chart account. Instructions are located on the last page of this paperwork. If you have not heard from us regarding the results in 2 weeks, please contact this office.

## 2018-01-19 LAB — CBC
HEMOGLOBIN: 15.1 g/dL (ref 13.0–17.7)
Hematocrit: 46.1 % (ref 37.5–51.0)
MCH: 30.8 pg (ref 26.6–33.0)
MCHC: 32.8 g/dL (ref 31.5–35.7)
MCV: 94 fL (ref 79–97)
PLATELETS: 310 10*3/uL (ref 150–450)
RBC: 4.91 x10E6/uL (ref 4.14–5.80)
RDW: 14 % (ref 12.3–15.4)
WBC: 6.8 10*3/uL (ref 3.4–10.8)

## 2018-01-19 LAB — LIPID PANEL
CHOLESTEROL TOTAL: 185 mg/dL (ref 100–199)
Chol/HDL Ratio: 4.5 ratio (ref 0.0–5.0)
HDL: 41 mg/dL (ref >39)
LDL Calculated: 125 mg/dL — ABNORMAL HIGH (ref 0–99)
Triglycerides: 95 mg/dL (ref 0–149)
VLDL Cholesterol Cal: 19 mg/dL (ref 5–40)

## 2018-01-19 LAB — BASIC METABOLIC PANEL
BUN/Creatinine Ratio: 10 (ref 9–20)
BUN: 9 mg/dL (ref 6–24)
CALCIUM: 9.6 mg/dL (ref 8.7–10.2)
CO2: 23 mmol/L (ref 20–29)
CREATININE: 0.87 mg/dL (ref 0.76–1.27)
Chloride: 104 mmol/L (ref 96–106)
GFR calc Af Amer: 125 mL/min/{1.73_m2} (ref >59)
GFR calc non Af Amer: 108 mL/min/{1.73_m2} (ref >59)
Glucose: 96 mg/dL (ref 65–99)
Potassium: 4.2 mmol/L (ref 3.5–5.2)
Sodium: 141 mmol/L (ref 134–144)

## 2018-01-19 LAB — HIV ANTIBODY (ROUTINE TESTING W REFLEX): HIV Screen 4th Generation wRfx: NONREACTIVE

## 2018-01-21 LAB — GC/CHLAMYDIA PROBE AMP
CHLAMYDIA, DNA PROBE: NEGATIVE
Neisseria gonorrhoeae by PCR: NEGATIVE

## 2018-01-23 ENCOUNTER — Ambulatory Visit: Payer: BLUE CROSS/BLUE SHIELD | Admitting: Physician Assistant

## 2018-01-23 VITALS — BP 124/72 | HR 84 | Temp 98.3°F | Resp 16 | Ht 70.0 in | Wt 153.6 lb

## 2018-01-23 DIAGNOSIS — F410 Panic disorder [episodic paroxysmal anxiety] without agoraphobia: Secondary | ICD-10-CM

## 2018-01-23 DIAGNOSIS — Z125 Encounter for screening for malignant neoplasm of prostate: Secondary | ICD-10-CM | POA: Diagnosis not present

## 2018-01-23 MED ORDER — ESCITALOPRAM OXALATE 5 MG PO TABS: 5.0000 mg | ORAL_TABLET | Freq: Every day | ORAL | 5 refills | Status: DC

## 2018-01-23 MED ORDER — CLONAZEPAM 1 MG PO TABS: 0.5000 mg | ORAL_TABLET | Freq: Two times a day (BID) | ORAL | 1 refills | Status: DC | PRN

## 2018-01-23 NOTE — Progress Notes (Signed)
01/24/2018 9:18 AM   DOB: 01-24-1976 / MRN: [id number]  SUBJECTIVE:  Nathan Mccall is a 42 y.o. male presenting for discussion of anxiety and panic.  Patient tells me had a panic attack last Tuesday while at work and states that all of a sudden he felt shaky, exquisitely worried, mildly sweaty, felt his heart beating fast.  He has a history of this.  He had no chest pain or shortness of breath during this episode.  Tells me over the last 3 years has become more and more anxious states that he has had Aleve grocery stores before in the middle of shopping due to excessive anxiousness and worry.  He cannot pinpoint any triggers.  Most recent annual physical exam was completely normal.  He tells me he has the typical worries, marriage, finances, kids.  He denies any history of traumas and abuse.  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  Denies any illicit drug use consumes alcohol in moderation.  He has No Known Allergies.   He  has a past medical history of GERD (gastroesophageal reflux disease) and Heart murmur.    He  reports that he has quit smoking. He has never used smokeless tobacco. He reports that he drinks about 3.6 oz of alcohol per week. He reports that he does not use drugs. He  reports that he currently engages in sexual activity. The patient  has a past surgical history that includes Appendectomy (1987).  His family history includes Cancer in his maternal grandmother and paternal grandmother; Diabetes in his maternal grandfather and mother.  Review of Systems  Constitutional: Negative for chills, diaphoresis and fever.  Eyes: Negative.   Respiratory: Negative for cough, hemoptysis, sputum production, shortness of breath and wheezing.   Cardiovascular: Negative for chest pain, orthopnea and leg swelling.  Gastrointestinal: Negative for abdominal pain, blood in stool, constipation, diarrhea, heartburn, melena, nausea and vomiting.  Genitourinary: Negative for dysuria, flank pain, frequency, hematuria  and urgency.  Skin: Negative for rash.  Neurological: Negative for dizziness, sensory change, speech change, focal weakness and headaches.    The problem list and medications were reviewed and updated by myself where necessary and exist elsewhere in the encounter.   OBJECTIVE:  BP 124/72   Pulse 84   Temp 98.3 F (36.8 C) (Oral)   Resp 16   Ht 5\' 10"  (1.778 m)   Wt 153 lb 9.6 oz (69.7 kg)   SpO2 97%   BMI 22.04 kg/m   Physical Exam  Constitutional: He is oriented to person, place, and time. He appears well-developed. He is active.  Non-toxic appearance. He does not appear ill.  Eyes: Pupils are equal, round, and reactive to light. Conjunctivae and EOM are normal.  Cardiovascular: Normal rate, regular rhythm, S1 normal, S2 normal, normal heart sounds, intact distal pulses and normal pulses. Exam reveals no gallop and no friction rub.  No murmur heard. Pulmonary/Chest: Effort normal. No stridor. No respiratory distress. He has no wheezes. He has no rales.  Abdominal: He exhibits no distension.  Musculoskeletal: Normal range of motion. He exhibits no edema.  Neurological: He is alert and oriented to person, place, and time. He has normal strength and normal reflexes. He is not disoriented. No cranial nerve deficit or sensory deficit. He exhibits normal muscle tone. Coordination and gait normal.  Skin: Skin is warm and dry. He is not diaphoretic. No pallor.  Psychiatric: He has a normal mood and affect. His behavior is normal.  Nursing note and  vitals reviewed.   Results for orders placed or performed in visit on 01/18/18 (from the past 72 hour(s))  PSA     Status: None   Collection Time: 01/23/18  5:29 PM  Result Value Ref Range   Prostate Specific Ag, Serum 1.0 0.0 - 4.0 ng/mL    Comment: Roche ECLIA methodology. According to the American Urological Association, Serum PSA should decrease and remain at undetectable levels after radical prostatectomy. The AUA defines biochemical  recurrence as an initial PSA value 0.2 ng/mL or greater followed by a subsequent confirmatory PSA value 0.2 ng/mL or greater. Values obtained with different assay methods or kits cannot be used interchangeably. Results cannot be interpreted as absolute evidence of the presence or absence of malignant disease.     No results found.  ASSESSMENT AND PLAN:  Amier was seen today for anxiety.  Diagnoses and all orders for this visit:  Severe anxiety with panic: Follow-up in 6 weeks. -     escitalopram (LEXAPRO) 5 MG tablet; Take 1 tablet (5 mg total) by mouth daily. -     clonazePAM (KLONOPIN) 1 MG tablet; Take 0.5-1 tablets (0.5-1 mg total) by mouth 2 (two) times daily as needed for anxiety.    The patient is advised to call or return to clinic if he does not see an improvement in symptoms, or to seek the care of the closest emergency department if he worsens with the above plan.   Deliah Boston, MHS, PA-C Primary Care at [facility] 01/24/2018 9:18 AM

## 2018-01-23 NOTE — Addendum Note (Signed)
Addended by: Ofilia NeasLARK, Jaimee Corum L on: 01/23/2018 05:23 PM   Modules accepted: Orders

## 2018-01-23 NOTE — Patient Instructions (Addendum)
  Start lexapro tonight with food.  Don't miss doses.  You have clonopin if you need it.  Come back in about 6 weeks.    IF you received an x-ray today, you will receive an invoice from [facility]. Please contact [facility] at [phone] with questions or concerns regarding your invoice.   IF you received labwork today, you will receive an invoice from [facility]. Please contact [facility] at [phone] with questions or concerns regarding your invoice.   Our billing staff will not be able to assist you with questions regarding bills from these companies.  You will be contacted with the lab results as soon as they are available. The fastest way to get your results is to activate your My Chart account. Instructions are located on the last page of this paperwork. If you have not heard from us regarding the results in 2 weeks, please contact this office.

## 2018-01-24 LAB — PSA: Prostate Specific Ag, Serum: 1 ng/mL (ref 0.0–4.0)

## 2018-03-12 ENCOUNTER — Ambulatory Visit: Payer: BLUE CROSS/BLUE SHIELD | Admitting: Physician Assistant

## 2018-04-12 ENCOUNTER — Ambulatory Visit: Payer: BLUE CROSS/BLUE SHIELD | Admitting: Physician Assistant

## 2018-04-17 ENCOUNTER — Telehealth: Payer: Self-pay | Admitting: Family Medicine

## 2018-04-17 NOTE — Telephone Encounter (Signed)
Copied from CRM [id number]. Topic: General - Other >> Apr 17, 2018  4:56 PM Tamela OddiHarris, Brenda J wrote: Reason for CRM: Patient called because he is concerned about contracting [place], since one of his co-workers has recently been diagnosed with it.  Patient wants to know is there any precautions he should take.  CB# [phone].

## 2018-04-24 NOTE — Telephone Encounter (Signed)
Per pt he already got info needed regarding precautions since no one from our office called him back.  Apologized for no one getting back to him.  Pt agreeable. Dgaddy, CMA

## 2018-07-11 ENCOUNTER — Other Ambulatory Visit: Payer: Self-pay | Admitting: Physician Assistant

## 2018-07-11 DIAGNOSIS — F410 Panic disorder [episodic paroxysmal anxiety] without agoraphobia: Secondary | ICD-10-CM

## 2018-07-26 ENCOUNTER — Ambulatory Visit: Payer: BLUE CROSS/BLUE SHIELD | Admitting: Osteopathic Medicine

## 2018-07-26 ENCOUNTER — Other Ambulatory Visit: Payer: Self-pay

## 2018-07-26 ENCOUNTER — Encounter: Payer: Self-pay | Admitting: Osteopathic Medicine

## 2018-07-26 DIAGNOSIS — F410 Panic disorder [episodic paroxysmal anxiety] without agoraphobia: Secondary | ICD-10-CM | POA: Insufficient documentation

## 2018-07-26 MED ORDER — ESCITALOPRAM OXALATE 10 MG PO TABS: 10.0000 mg | ORAL_TABLET | Freq: Every day | ORAL | 1 refills | Status: DC

## 2018-07-26 MED ORDER — ESCITALOPRAM OXALATE 5 MG PO TABS: 5.0000 mg | ORAL_TABLET | Freq: Every day | ORAL | 1 refills | Status: DC

## 2018-07-26 NOTE — Patient Instructions (Addendum)
  PLAN:  Refills given for 6 months of Lexapro.   At last checkup 01/2018, routine labs were done, plan to come back around 01/2019 to establish care with new provider and likely will want to repeat labs.  We increased dose from 5 to 10 mg. If this is working for you, great! If not, or if you'd like to discuss dose change or medication change, please come see us sooner than 6 months.   Happy Holidays!        If you have lab work done today you will be contacted with your lab results within the next 2 weeks.  If you have not heard from us then please contact us. The fastest way to get your results is to register for My Chart.   IF you received an x-ray today, you will receive an invoice from [facility]. Please contact [facility] at [phone] with questions or concerns regarding your invoice.   IF you received labwork today, you will receive an invoice from [facility]. Please contact [facility] at [phone] with questions or concerns regarding your invoice.   Our billing staff will not be able to assist you with questions regarding bills from these companies.  You will be contacted with the lab results as soon as they are available. The fastest way to get your results is to activate your My Chart account. Instructions are located on the last page of this paperwork. If you have not heard from us regarding the results in 2 weeks, please contact this office.

## 2018-07-26 NOTE — Progress Notes (Signed)
HPI: Nathan Mccall is a 42 y.o. male who  has a past medical history of GERD (gastroesophageal reflux disease) and Heart murmur.  [facility], 07/26/18,  for chief complaint of:  Medication refill - Lexapro   Doing well on Lexapro but would like to try going up on dose to see if this improves mood further. Klonopin sparing use, he still has plenty left from last Rx. Needs refills, previous PCP left this practice  Last labs 6 mos ago: BMP and CBC ok       Past medical history, surgical history, and family history reviewed.  Current medication list and allergy/intolerance information reviewed.   (See remainder of HPI, ROS, Phys Exam below)     ASSESSMENT/PLAN:   Severe anxiety with panic - Plan: escitalopram (LEXAPRO) 10 MG tablet, DISCONTINUED: escitalopram (LEXAPRO) 5 MG tablet   Meds ordered this encounter  Medications  . DISCONTD: escitalopram (LEXAPRO) 5 MG tablet    Sig: Take 1 tablet (5 mg total) by mouth daily.    Dispense:  90 tablet    Refill:  1  . escitalopram (LEXAPRO) 10 MG tablet    Sig: Take 1 tablet (10 mg total) by mouth daily.    Dispense:  90 tablet    Refill:  1    Please cancel refill for 5 mg thanks    Patient Instructions    PLAN:  Refills given for 6 months of Lexapro.   At last checkup 01/2018, routine labs were done, plan to come back around 01/2019 to establish care with new provider and likely will want to repeat labs.  We increased dose from 5 to 10 mg. If this is working for you, great! If not, or if you'd like to discuss dose change or medication change, please come see us sooner than 6 months.   Happy Holidays!             Follow-up plan: Return in about 6 months (around 01/25/2019) for ESTABLISH CARE WITH NEW PROVIDER IN CLINIC, MAINTAIN REFILLS, ROUTINE LABS  .                  ############################################ ############################################ ############################################ ############################################    Outpatient Encounter Medications as of 07/26/2018  Medication Sig  . clonazePAM (KLONOPIN) 1 MG tablet Take 0.5-1 tablets (0.5-1 mg total) by mouth 2 (two) times daily as needed for anxiety.  Marland Kitchen. escitalopram (LEXAPRO) 5 MG tablet Take 1 tablet (5 mg total) by mouth daily. Needs appointment for refills.   No facility-administered encounter medications on file as of 07/26/2018.    No Known Allergies    Review of Systems:  Constitutional: No recent illness  Cardiac: No  chest pain  Respiratory:  No  shortness of breath  Neurologic: No  weakness, No  Dizziness  Psychiatric: No  concerns with depression, +concerns with anxiety  Depression screen [facility] 2/[facility] 07/26/2018 01/23/2018 01/18/2018  Decreased Interest 0 0 0  Down, Depressed, Hopeless 0 0 0  PHQ - 2 Score 0 0 0     Exam:  BP 124/75 (BP Location: Right Arm, Patient Position: Sitting, Cuff Size: Normal)   Pulse 68   Temp 98.3 F (36.8 C) (Oral)   Ht 5\' 10"  (1.778 m)   Wt 156 lb 9.6 oz (71 kg)   SpO2 98%   BMI 22.47 kg/m   Constitutional: VS see above. General Appearance: alert, well-developed, well-nourished, NAD  Eyes: Normal lids and conjunctive, non-icteric sclera  Ears, Nose, Mouth, Throat: MMM, Normal external inspection ears/nares/mouth/lips/gums.  Neck: No masses, trachea midline.   Respiratory: Normal respiratory effort. no wheeze, no rhonchi, no rales  Cardiovascular: S1/S2 normal, no murmur, no rub/gallop auscultated. RRR.   Musculoskeletal: Gait normal. Symmetric and independent movement of all extremities  Neurological: Normal balance/coordination. No tremor.  Skin: warm, dry, intact.   Psychiatric: Normal judgment/insight. Normal mood and affect. Oriented x3.   Visit summary with  medication list and pertinent instructions was printed for patient to review, advised to alert [place] if any changes needed. All questions at time of visit were answered - patient instructed to contact office with any additional concerns. ER/RTC precautions were reviewed with the patient and understanding verbalized.   Follow-up plan: Return in about 6 months (around 01/25/2019) for ESTABLISH CARE WITH NEW PROVIDER IN CLINIC, MAINTAIN REFILLS, ROUTINE LABS .  Note: Total time spent 15 minutes, greater than 50% of the visit was spent face-to-face counseling and coordinating care for the following: The encounter diagnosis was Severe anxiety with panic.Marland Kitchen  Please note: voice recognition software was used to produce this document, and typos may escape review. Please contact Dr. Lyn Hollingshead for any needed clarifications.

## 2019-09-02 ENCOUNTER — Ambulatory Visit: Payer: 59 | Admitting: Primary Care

## 2019-09-02 ENCOUNTER — Encounter: Payer: Self-pay | Admitting: Primary Care

## 2019-09-02 ENCOUNTER — Other Ambulatory Visit: Payer: Self-pay

## 2019-09-02 DIAGNOSIS — M779 Enthesopathy, unspecified: Secondary | ICD-10-CM | POA: Insufficient documentation

## 2019-09-02 HISTORY — DX: Enthesopathy, unspecified: M77.9

## 2019-09-02 MED ORDER — NAPROXEN 500 MG PO TABS: 500.0000 mg | ORAL_TABLET | Freq: Two times a day (BID) | ORAL | 0 refills | Status: DC

## 2019-09-02 MED ORDER — KETOROLAC TROMETHAMINE 60 MG/2ML IM SOLN
60.0000 mg | Freq: Once | INTRAMUSCULAR | Status: AC
  Administered 2019-09-02: 09:00:00 60 mg via INTRAMUSCULAR

## 2019-09-02 NOTE — Patient Instructions (Signed)
Start naproxen 500 mg twice daily with a meal tomorrow.  Wear the brace while at work.  Schedule a visit with Dr. Patsy Lager in a few weeks if no improvement.  It was a pleasure to meet you today! Please don't hesitate to call or message me with any questions. Welcome to [employer] [facility] [employer]!

## 2019-09-02 NOTE — Progress Notes (Signed)
Subjective:    Patient ID: Nathan Mccall, male    DOB: June 27, 1976, 44 y.o.   MRN: [id number]  HPI  This visit occurred during the SARS-CoV-2 public health emergency.  Safety protocols were in place, including screening questions prior to the visit, additional usage of staff PPE, and extensive cleaning of exam room while observing appropriate contact time as indicated for disinfecting solutions.   Nathan Mccall is a 44 year old male who presents today to establish care and discuss the problems mentioned below. Will obtain/review records.  1) Extremity Pain: His pain is located to the lateral forearm distal to elbow. Symptoms began about 6 weeks ago and has been constant since. His pain is present when picking up light to heavy objects including a cup, tools, heavy thinks. If his arms are extended then he doesn't notice pain with lifting/movement.   He's tried taking Motrin and Aleve for about three days consecutively with some improvement but temporary. He's using Ice/heat without much improvement, also has a few braces that he inconsistently wears.  He's getting to the point where this discomfort has interfered with his work.   He denies erythema, tenderness, numbness/tingling, swelling. 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   BP Readings from Last 3 Encounters:  09/02/19 122/84  07/26/18 124/75  01/23/18 124/72     Review of Systems  Musculoskeletal: Positive for myalgias.  Skin: Negative for color change.  Neurological: Negative for weakness and numbness.       Past Medical History:  Diagnosis Date  . GERD (gastroesophageal reflux disease)   . Heart murmur      Social History   Socioeconomic History  . Marital status: Married    Spouse name: Not on file  . Number of children: Not on file  . Years of education: Not on file  . Highest education level: Not on file  Occupational History  . Not on file  Tobacco Use  . Smoking status: Former  Research scientist (life sciences)  . Smokeless tobacco: Never Used  Substance and Sexual Activity  . Alcohol use: Yes    Alcohol/week: 6.0 standard drinks    Types: 6 Standard drinks or equivalent per week  . Drug use: No  . Sexual activity: Yes  Other Topics Concern  . Not on file  Social History Narrative  . Not on file   Social Determinants of Health   Financial Resource Strain:   . Difficulty of Paying Living Expenses: Not on file  Food Insecurity:   . Worried About Charity fundraiser in the Last Year: Not on file  . Ran Out of Food in the Last Year: Not on file  Transportation Needs:   . Lack of Transportation (Medical): Not on file  . Lack of Transportation (Non-Medical): Not on file  Physical Activity:   . Days of Exercise per Week: Not on file  . Minutes of Exercise per Session: Not on file  Stress:   . Feeling of Stress : Not on file  Social Connections:   . Frequency of Communication with Friends and Family: Not on file  . Frequency of Social Gatherings with Friends and Family: Not on file  . Attends Religious Services: Not on file  . Active Member of Clubs or Organizations: Not on file  . Attends Archivist Meetings: Not on file  . Marital Status: Not on file  Intimate Partner Violence:   . Fear of Current or Ex-Partner: Not on file  .  Emotionally Abused: Not on file  . Physically Abused: Not on file  . Sexually Abused: Not on file    Past Surgical History:  Procedure Laterality Date  . APPENDECTOMY  1987  . TONSILLECTOMY  1995    Family History  Problem Relation Age of Onset  . Diabetes Mother   . Depression Mother   . Breast cancer Maternal Grandmother   . Diabetes Maternal Grandfather   . Lung cancer Paternal Grandmother     No Known Allergies  No current outpatient medications on file prior to visit.   No current facility-administered medications on file prior to visit.    BP 122/84   Pulse 72   Temp (!) 97.2 F (36.2 C) (Temporal)   Ht 5\' 10"  (1.778  m)   Wt 161 lb 12 oz (73.4 kg)   SpO2 98%   BMI 23.21 kg/m    Objective:   Physical Exam  Constitutional: He appears well-nourished.  Cardiovascular: Normal rate and regular rhythm.  Respiratory: Effort normal and breath sounds normal.  Musculoskeletal:     Left forearm: No swelling, edema, deformity, tenderness or bony tenderness.       Arms:     Cervical back: Neck supple.     Comments: 5/5 strength to bilateral upper extremity. Grip strength equal.  Skin: Skin is warm and dry.  Psychiatric: He has a normal mood and affect.           Assessment & Plan:

## 2019-09-02 NOTE — Assessment & Plan Note (Signed)
Acute for the last 6 weeks, temporary improvement with short duration of NSAID's.  Exam today overall unremarkable.  Start with IM Toradol 60 mg today. Rx for naproxen 500 mg BID x 2 weeks sent to pharmacy. Discussed to wear brace daily while at work. Follow up with Sports Medicine if no improvement.  He will update.

## 2019-09-02 NOTE — Addendum Note (Signed)
Addended by: Tawnya Crook on: 09/02/2019 11:40 AM   Modules accepted: Orders

## 2019-10-01 ENCOUNTER — Ambulatory Visit (INDEPENDENT_AMBULATORY_CARE_PROVIDER_SITE_OTHER): Payer: 59 | Admitting: Family Medicine

## 2019-10-01 ENCOUNTER — Encounter: Payer: Self-pay | Admitting: Family Medicine

## 2019-10-01 VITALS — Temp 96.9°F | Ht 70.0 in | Wt 151.0 lb

## 2019-10-01 DIAGNOSIS — U071 COVID-19: Secondary | ICD-10-CM

## 2019-10-01 DIAGNOSIS — H9202 Otalgia, left ear: Secondary | ICD-10-CM

## 2019-10-01 MED ORDER — AMOXICILLIN 875 MG PO TABS: 875.0000 mg | ORAL_TABLET | Freq: Two times a day (BID) | ORAL | 0 refills | Status: AC

## 2019-10-01 NOTE — Progress Notes (Signed)
     Nathan Gimbel T. Peregrine Nolt, MD Primary Care and Sports Medicine [facility] [address] [place], [address] Phone: [phone]  FAX: [phone]  Nathan Mccall - 44 y.o. male  MRN [id number]  Date of Birth: 1976-02-07  Visit Date: 10/01/2019  PCP: Doreene Nest, NP  Referred by: Doreene Nest, NP Chief Complaint  Patient presents with  . Ear Pain    Left + Covid test last Wednesday   Virtual Visit via Video Note:  I connected with  Nathan Mccall on 10/01/2019 11:20 AM EST by a video enabled telemedicine application and verified that I am speaking with the correct person using two identifiers.   Location patient: home computer, tablet, or smartphone Location provider: work or home office Consent: Verbal consent directly obtained from Nathan Mccall. Persons participating in the virtual visit: patient, provider  I discussed the limitations of evaluation and management by telemedicine and the availability of in person appointments. The patient expressed understanding and agreed to proceed.  History of Present Illness:  Covid-19 last wed.  He also had some congestion and sinus pressure along with this otherwise he is all completely asymptomatic and he never had a fever.  He complains now of some worsening left-sided ear pain.  He also is having some jaw pain and pain in and around the ear.  Now with some L ear pain.  Review of Systems as above: See pertinent positives and pertinent negatives per HPI No acute distress verbally   Observations/Objective/Exam:  An attempt was made to discern vital signs over the phone and per patient if applicable and possible.   General:    Alert, Oriented, appears well and in no acute distress HEENT:     Atraumatic, conjunctiva clear, no obvious abnormalities on inspection of external nose and ears.  Neck:    Normal movements of the head and neck Pulmonary:     On inspection no signs  of respiratory distress, breathing rate appears normal, no obvious gross SOB, gasping or wheezing Cardiovascular:    No obvious cyanosis Musculoskeletal:    Moves all visible extremities without noticeable abnormality Psych / Neurological:     Pleasant and cooperative, no obvious depression or anxiety, speech and thought processing grossly intact  Assessment and Plan:  COVID-19  Acute ear pain, left   By clinical history, most likely otitis media.  No exam is possible.  Treat as such.  I discussed the assessment and treatment plan with the patient. The patient was provided an opportunity to ask questions and all were answered. The patient agreed with the plan and demonstrated an understanding of the instructions.   The patient was advised to call back or seek an in-person evaluation if the symptoms worsen or if the condition fails to improve as anticipated.  Follow-up: prn unless noted otherwise below No follow-ups on file.  Meds ordered this encounter  Medications  . amoxicillin (AMOXIL) 875 MG tablet    Sig: Take 1 tablet (875 mg total) by mouth 2 (two) times daily for 10 days.    Dispense:  20 tablet    Refill:  0   No orders of the defined types were placed in this encounter.   Signed,  Elpidio Galea. Cordera Stineman, MD

## 2019-10-22 ENCOUNTER — Encounter: Payer: Self-pay | Admitting: Primary Care

## 2019-10-22 ENCOUNTER — Ambulatory Visit: Payer: 59 | Admitting: Primary Care

## 2019-10-22 ENCOUNTER — Other Ambulatory Visit: Payer: Self-pay

## 2019-10-22 DIAGNOSIS — R5382 Chronic fatigue, unspecified: Secondary | ICD-10-CM | POA: Diagnosis not present

## 2019-10-22 DIAGNOSIS — R5383 Other fatigue: Secondary | ICD-10-CM | POA: Insufficient documentation

## 2019-10-22 LAB — COMPREHENSIVE METABOLIC PANEL
ALT: 22 U/L (ref 0–53)
AST: 18 U/L (ref 0–37)
Albumin: 4.4 g/dL (ref 3.5–5.2)
Alkaline Phosphatase: 63 U/L (ref 39–117)
BUN: 12 mg/dL (ref 6–23)
CO2: 30 mEq/L (ref 19–32)
Calcium: 10.1 mg/dL (ref 8.4–10.5)
Chloride: 105 mEq/L (ref 96–112)
Creatinine, Ser: 0.81 mg/dL (ref 0.40–1.50)
GFR: 104.4 mL/min (ref >60.00)
Glucose, Bld: 98 mg/dL (ref 70–99)
Potassium: 4.8 mEq/L (ref 3.5–5.1)
Sodium: 141 mEq/L (ref 135–145)
Total Bilirubin: 0.5 mg/dL (ref 0.2–1.2)
Total Protein: 6.7 g/dL (ref 6.0–8.3)

## 2019-10-22 LAB — CBC
HCT: 43.3 % (ref 39.0–52.0)
Hemoglobin: 14.6 g/dL (ref 13.0–17.0)
MCHC: 33.8 g/dL (ref 30.0–36.0)
MCV: 91 fl (ref 78.0–100.0)
Platelets: 295 10*3/uL (ref 150.0–400.0)
RBC: 4.76 Mil/uL (ref 4.22–5.81)
RDW: 13.4 % (ref 11.5–15.5)
WBC: 6.7 10*3/uL (ref 4.0–10.5)

## 2019-10-22 LAB — VITAMIN B12: Vitamin B-12: 1225 pg/mL — ABNORMAL HIGH (ref 211–911)

## 2019-10-22 LAB — TSH: TSH: 0.9 u[IU]/mL (ref 0.35–4.50)

## 2019-10-22 LAB — TESTOSTERONE: Testosterone: 283.4 ng/dL — ABNORMAL LOW (ref 300.00–890.00)

## 2019-10-22 LAB — VITAMIN D 25 HYDROXY (VIT D DEFICIENCY, FRACTURES): VITD: 27.21 ng/mL — ABNORMAL LOW (ref 30.00–100.00)

## 2019-10-22 NOTE — Patient Instructions (Signed)
Stop by the lab prior to leaving today. I will notify you of your results once received.   It was a pleasure to see you today!  

## 2019-10-22 NOTE — Progress Notes (Signed)
Subjective:    Patient ID: Nathan Mccall, male    DOB: 1976-03-01, 44 y.o.   MRN: [id number]  HPI  This visit occurred during the SARS-CoV-2 public health emergency.  Safety protocols were in place, including screening questions prior to the visit, additional usage of staff PPE, and extensive cleaning of exam room while observing appropriate contact time as indicated for disinfecting solutions.   Nathan Mccall is a 44 year old male with a history of Covid-19 infection who presents today with a chief complaint of fatigue.   Diagnosed with Coivd-19 in early February 2021, symptoms included loss of smell, extreme tiredness, ear infection (treated with antibiotics), sore throat, head congestion.   Since Covid-19 infection he "feels exhausted", low energy. He also endorses a chronic history of fatigue, but since Covid infection his fatigue is worse.   He works on his feet for 10 hours daily, five days weekly. He has trouble falling asleep at times, takes Magnesium with improvement. He doesn't typically wake during the night, sleeps about 8 hours most nights. His fatigue now is much worse than normal. He does snore per his wife, doesn't believe he has periods of apnea during the night as he doesn't wake. No family history of sleep apnea.   He denies feeling overly anxious/stressed. He endorses a health diet over the last month. He isn't getting much exercise but is active during the day.   BP Readings from Last 3 Encounters:  10/22/19 126/80  09/02/19 122/84  07/26/18 124/75     Review of Systems  Constitutional: Positive for fatigue.  Respiratory: Negative for shortness of breath.        Snoring   Cardiovascular: Negative for chest pain.  Neurological: Negative for dizziness.  Psychiatric/Behavioral: Negative for sleep disturbance. The patient is not nervous/anxious.        Past Medical History:  Diagnosis Date  . GERD (gastroesophageal reflux disease)   . Heart murmur        Social History   Socioeconomic History  . Marital status: Married    Spouse name: Not on file  . Number of children: Not on file  . Years of education: Not on file  . Highest education level: Not on file  Occupational History  . Not on file  Tobacco Use  . Smoking status: Former Research scientist (life sciences)  . Smokeless tobacco: Never Used  Substance and Sexual Activity  . Alcohol use: Yes    Alcohol/week: 6.0 standard drinks    Types: 6 Standard drinks or equivalent per week  . Drug use: No  . Sexual activity: Yes  Other Topics Concern  . Not on file  Social History Narrative  . Not on file   Social Determinants of Health   Financial Resource Strain:   . Difficulty of Paying Living Expenses: Not on file  Food Insecurity:   . Worried About Charity fundraiser in the Last Year: Not on file  . Ran Out of Food in the Last Year: Not on file  Transportation Needs:   . Lack of Transportation (Medical): Not on file  . Lack of Transportation (Non-Medical): Not on file  Physical Activity:   . Days of Exercise per Week: Not on file  . Minutes of Exercise per Session: Not on file  Stress:   . Feeling of Stress : Not on file  Social Connections:   . Frequency of Communication with Friends and Family: Not on file  . Frequency of Social Gatherings with Friends and  Family: Not on file  . Attends Religious Services: Not on file  . Active Member of Clubs or Organizations: Not on file  . Attends Banker Meetings: Not on file  . Marital Status: Not on file  Intimate Partner Violence:   . Fear of Current or Ex-Partner: Not on file  . Emotionally Abused: Not on file  . Physically Abused: Not on file  . Sexually Abused: Not on file    Past Surgical History:  Procedure Laterality Date  . APPENDECTOMY  1987  . TONSILLECTOMY  1995    Family History  Problem Relation Age of Onset  . Diabetes Mother   . Depression Mother   . Breast cancer Maternal Grandmother   . Diabetes Maternal  Grandfather   . Lung cancer Paternal Grandmother     No Known Allergies  No current outpatient medications on file prior to visit.   No current facility-administered medications on file prior to visit.    BP 126/80   Pulse 80   Temp (!) 96.8 F (36 C) (Temporal)   Ht 5\' 10"  (1.778 m)   Wt 157 lb 8 oz (71.4 kg)   SpO2 98%   BMI 22.60 kg/m    Objective:   Physical Exam  Constitutional: He appears well-nourished.  Cardiovascular: Normal rate and regular rhythm.  Respiratory: Effort normal and breath sounds normal.  Musculoskeletal:     Cervical back: Neck supple.  Skin: Skin is warm and dry.  Psychiatric: He has a normal mood and affect.           Assessment & Plan:

## 2019-10-22 NOTE — Assessment & Plan Note (Signed)
Chronic fatigue for years, worse since Covid-19 infection. Given chronic symptoms we will check labs including TSH, testosterone, CBC, CMP, vitamin levels.  Consider sleep study if labs negative. Do suspect that increased fatigue is likely secondary to Covid-19 infection.  Await labs.

## 2019-10-24 DIAGNOSIS — E349 Endocrine disorder, unspecified: Secondary | ICD-10-CM

## 2020-05-27 ENCOUNTER — Other Ambulatory Visit (INDEPENDENT_AMBULATORY_CARE_PROVIDER_SITE_OTHER): Payer: 59

## 2020-05-27 ENCOUNTER — Other Ambulatory Visit: Payer: Self-pay

## 2020-05-27 DIAGNOSIS — E349 Endocrine disorder, unspecified: Secondary | ICD-10-CM

## 2020-05-27 LAB — TESTOSTERONE: Testosterone: 340.97 ng/dL (ref 300.00–890.00)

## 2020-05-27 NOTE — Addendum Note (Signed)
Addended by: Damita Lack on: 05/27/2020 09:14 AM   Modules accepted: Orders

## 2020-10-14 ENCOUNTER — Ambulatory Visit (INDEPENDENT_AMBULATORY_CARE_PROVIDER_SITE_OTHER): Payer: 59 | Admitting: Primary Care

## 2020-10-14 ENCOUNTER — Encounter: Payer: Self-pay | Admitting: Primary Care

## 2020-10-14 ENCOUNTER — Other Ambulatory Visit: Payer: Self-pay

## 2020-10-14 VITALS — BP 110/62 | HR 82 | Temp 98.1°F | Ht 70.0 in | Wt 157.0 lb

## 2020-10-14 DIAGNOSIS — R5382 Chronic fatigue, unspecified: Secondary | ICD-10-CM | POA: Diagnosis not present

## 2020-10-14 DIAGNOSIS — E349 Endocrine disorder, unspecified: Secondary | ICD-10-CM

## 2020-10-14 DIAGNOSIS — Z1159 Encounter for screening for other viral diseases: Secondary | ICD-10-CM

## 2020-10-14 DIAGNOSIS — E559 Vitamin D deficiency, unspecified: Secondary | ICD-10-CM

## 2020-10-14 DIAGNOSIS — F411 Generalized anxiety disorder: Secondary | ICD-10-CM | POA: Diagnosis not present

## 2020-10-14 LAB — COMPREHENSIVE METABOLIC PANEL
ALT: 46 U/L (ref 0–53)
AST: 24 U/L (ref 0–37)
Albumin: 4.5 g/dL (ref 3.5–5.2)
Alkaline Phosphatase: 65 U/L (ref 39–117)
BUN: 18 mg/dL (ref 6–23)
CO2: 29 mEq/L (ref 19–32)
Calcium: 10 mg/dL (ref 8.4–10.5)
Chloride: 103 mEq/L (ref 96–112)
Creatinine, Ser: 0.77 mg/dL (ref 0.40–1.50)
GFR: 109.9 mL/min (ref >60.00)
Glucose, Bld: 99 mg/dL (ref 70–99)
Potassium: 4.8 mEq/L (ref 3.5–5.1)
Sodium: 140 mEq/L (ref 135–145)
Total Bilirubin: 0.5 mg/dL (ref 0.2–1.2)
Total Protein: 6.8 g/dL (ref 6.0–8.3)

## 2020-10-14 LAB — VITAMIN B12: Vitamin B-12: 595 pg/mL (ref 211–911)

## 2020-10-14 LAB — VITAMIN D 25 HYDROXY (VIT D DEFICIENCY, FRACTURES): VITD: 39.4 ng/mL (ref 30.00–100.00)

## 2020-10-14 LAB — LIPID PANEL
Cholesterol: 193 mg/dL (ref 0–200)
HDL: 38.3 mg/dL — ABNORMAL LOW (ref >39.00)
LDL Cholesterol: 141 mg/dL — ABNORMAL HIGH (ref 0–99)
NonHDL: 155.19
Total CHOL/HDL Ratio: 5
Triglycerides: 72 mg/dL (ref 0.0–149.0)
VLDL: 14.4 mg/dL (ref 0.0–40.0)

## 2020-10-14 LAB — TESTOSTERONE: Testosterone: 385.87 ng/dL (ref 300.00–890.00)

## 2020-10-14 MED ORDER — ESCITALOPRAM OXALATE 10 MG PO TABS: 10.0000 mg | ORAL_TABLET | Freq: Every day | ORAL | 3 refills | Status: DC

## 2020-10-14 NOTE — Assessment & Plan Note (Signed)
Compliant to vitamin D 2000 IUs daily, repeat level pending.

## 2020-10-14 NOTE — Assessment & Plan Note (Signed)
Improved since last visit.  Repeat labs pending including vitamin D, vitamin B12, testosterone, CMP.  Add lipid panel.

## 2020-10-14 NOTE — Progress Notes (Signed)
Subjective:    Patient ID: Nathan Mccall, male    DOB: Sep 16, 1975, 45 y.o.   MRN: [id number]  HPI  This visit occurred during the SARS-CoV-2 public health emergency.  Safety protocols were in place, including screening questions prior to the visit, additional usage of staff PPE, and extensive cleaning of exam room while observing appropriate contact time as indicated for disinfecting solutions.   Nathan Mccall is a 45 year old male with a history of tobacco abuse and fatigue who presents today to discuss anxiety.  Previously managed on Lexapro 5-10 mg, has not taken in several years, never prescribed at this clinic. Long history of anxiety that dates back years ago, was on Lexapro 5 mg and did very well on this regimen.   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 Symptoms include feeling anxious, irritability, difficulty sleeping, mind racing thoughts.   He denies SI/HI, depression.   Chronic fatigue has improved.  He is compliant to vitamin D 2000 IUs daily, also stopped taking vitamin B12.  He is now taking a multivitamin for which he attributes to his improved symptoms.  GAD 7 : Generalized Anxiety Score 10/14/2020  Nervous, Anxious, on Edge 2  Control/stop worrying 2  Worry too much - different things 2  Trouble relaxing 2  Restless 2  Easily annoyed or irritable 2  Afraid - awful might happen 0  Total GAD 7 Score 12  Anxiety Difficulty Somewhat difficult    PHQ9 SCORE ONLY 10/14/2020 07/26/2018 01/23/2018  PHQ-9 Total Score 2 0 0     Review of Systems  Respiratory: Negative for shortness of breath.   Cardiovascular: Negative for chest pain.  Psychiatric/Behavioral: Positive for sleep disturbance. The patient is nervous/anxious.        See HPI       Past Medical History:  Diagnosis Date  . GERD (gastroesophageal reflux disease)   . Heart murmur      Social History   Socioeconomic History   . Marital status: Married    Spouse name: Not on file  . Number of children: Not on file  . Years of education: Not on file  . Highest education level: Not on file  Occupational History  . Not on file  Tobacco Use  . Smoking status: Former Games developer  . Smokeless tobacco: Never Used  Substance and Sexual Activity  . Alcohol use: Yes    Alcohol/week: 6.0 standard drinks    Types: 6 Standard drinks or equivalent per week  . Drug use: No  . Sexual activity: Yes  Other Topics Concern  . Not on file  Social History Narrative  . Not on file   Social Determinants of Health   Financial Resource Strain: Not on file  Food Insecurity: Not on file  Transportation Needs: Not on file  Physical Activity: Not on file  Stress: Not on file  Social Connections: Not on file  Intimate Partner Violence: Not on file    Past Surgical History:  Procedure Laterality Date  . APPENDECTOMY  1987  . TONSILLECTOMY  1995    Family History  Problem Relation Age of Onset  . Diabetes Mother   . Depression Mother   . Breast cancer Maternal Grandmother   . Diabetes Maternal Grandfather   . Lung cancer Paternal Grandmother     No Known Allergies  No current outpatient medications on file prior to visit.   No current facility-administered  medications on file prior to visit.    BP 110/62   Pulse 82   Temp 98.1 F (36.7 C) (Temporal)   Ht 5\' 10"  (1.778 m)   Wt 157 lb (71.2 kg)   SpO2 96%   BMI 22.53 kg/m    Objective:   Physical Exam Constitutional:      Appearance: He is well-nourished.  Cardiovascular:     Rate and Rhythm: Normal rate and regular rhythm.  Pulmonary:     Effort: Pulmonary effort is normal.     Breath sounds: Normal breath sounds.  Musculoskeletal:     Cervical back: Neck supple.  Skin:    General: Skin is warm and dry.  Psychiatric:        Mood and Affect: Mood and affect and mood normal.            Assessment & Plan:

## 2020-10-14 NOTE — Patient Instructions (Signed)
Stop by the lab prior to leaving today. I will notify you of your results once received.   Start the escitalopram (Lexapro) 10 mg for anxiety.  Start by taking 1/2 tablet by mouth once daily for about a week, then increase to 1 full tablet thereafter.  It was a pleasure to see you today!

## 2020-10-14 NOTE — Assessment & Plan Note (Signed)
Chronic history of anxiety, increased recently given new occupational change.  He did very well on Lexapro in the past, agreed to refill today.  Prescription for Lexapro 10 mg sent to pharmacy, discussed to start with one half tab daily for 1 week, then increase to 1 full tablet thereafter.  He will update if symptoms do not improve.

## 2020-10-14 NOTE — Assessment & Plan Note (Signed)
Initial check was low, second check within normal range.  Repeat testosterone level pending today.

## 2020-10-17 LAB — HEPATITIS C ANTIBODY
Hepatitis C Ab: NONREACTIVE
SIGNAL TO CUT-OFF: 0 (ref <1.00)

## 2020-12-06 ENCOUNTER — Telehealth: Payer: Self-pay | Admitting: Primary Care

## 2020-12-06 NOTE — Telephone Encounter (Addendum)
Pt states that he is having bad cough, chest discomfort, SOB, head congestion, sore throat, fatigue, runny nose. Cough is productive, clear mucous and some yellow at times. No fever. Some chills and body aches last night but not today. Temp last night got to 99.5 but he states that he was bundled up under several blankets due to chills. Pt states that he worked in the garden this weekend x 2 days and woke up this yesterday 4/18 with all these symptoms. Not sure if allergy related. Pt states that he does not feel like it is Covid - he has had Covid before. Pt has not been around anyone sick or anyone with Covid.  Pt is taking OTC meds for his symptoms - Advil, cough and cold meds.  Pharmacy: [facility]  No Known Allergies

## 2020-12-06 NOTE — Telephone Encounter (Signed)
Called patient will go to [facility] for evaluation. No further questions at this time.

## 2020-12-06 NOTE — Telephone Encounter (Signed)
Noted  

## 2020-12-09 ENCOUNTER — Other Ambulatory Visit: Payer: Self-pay

## 2020-12-09 ENCOUNTER — Encounter: Payer: Self-pay | Admitting: Emergency Medicine

## 2020-12-09 ENCOUNTER — Ambulatory Visit (INDEPENDENT_AMBULATORY_CARE_PROVIDER_SITE_OTHER): Payer: 59

## 2020-12-09 ENCOUNTER — Ambulatory Visit
Admission: EM | Admit: 2020-12-09 | Discharge: 2020-12-09 | Disposition: A | Discharge status: Home / Self Care | Payer: 59 | Attending: Internal Medicine | Admitting: Internal Medicine

## 2020-12-09 DIAGNOSIS — J209 Acute bronchitis, unspecified: Secondary | ICD-10-CM | POA: Diagnosis not present

## 2020-12-09 DIAGNOSIS — R062 Wheezing: Secondary | ICD-10-CM | POA: Diagnosis not present

## 2020-12-09 DIAGNOSIS — R059 Cough, unspecified: Secondary | ICD-10-CM

## 2020-12-09 LAB — POCT INFLUENZA A/B
Influenza A, POC: NEGATIVE
Influenza B, POC: NEGATIVE

## 2020-12-09 MED ORDER — METHYLPREDNISOLONE 4 MG PO TBPK: ORAL_TABLET | ORAL | 0 refills | Status: DC

## 2020-12-09 MED ORDER — ALBUTEROL SULFATE HFA 108 (90 BASE) MCG/ACT IN AERS: 2.0000 | INHALATION_SPRAY | RESPIRATORY_TRACT | 0 refills | Status: DC | PRN

## 2020-12-09 MED ORDER — AZITHROMYCIN 250 MG PO TABS: 250.0000 mg | ORAL_TABLET | Freq: Every day | ORAL | 0 refills | Status: DC

## 2020-12-09 NOTE — ED Provider Notes (Signed)
[facility]    CSN: [id number] Arrival date & time: 12/09/20  1554      History   Chief Complaint No chief complaint on file.   HPI Nathan Mccall is a 45 y.o. male who presents with nose and chest congestion x 5 days. Was having allergies acting up due to having a lot of sneezing, itchy eyes and had been outside. Took allergy pills the 1st 2 days and did not help. Has been wheezing and feeling SOB.  Had body aches the first 2 days. Has felt feverish.  Gets worse at night time and has cough attacks.      Past Medical History:  Diagnosis Date  . GERD (gastroesophageal reflux disease)   . Heart murmur     Patient Active Problem List   Diagnosis Date Noted  . GAD (generalized anxiety disorder) 10/14/2020  . Testosterone deficiency 10/14/2020  . Vitamin D deficiency 10/14/2020  . Fatigue 10/22/2019  . Tendonitis 09/02/2019  . Stopped smoking with greater than 20 pack year history 01/18/2018    Past Surgical History:  Procedure Laterality Date  . APPENDECTOMY  1987  . TONSILLECTOMY  1995       Home Medications    Prior to Admission medications   Medication Sig Start Date End Date Taking? Authorizing Provider  albuterol (VENTOLIN HFA) 108 (90 Base) MCG/ACT inhaler Inhale 2 puffs into the lungs every 4 (four) hours as needed for wheezing or shortness of breath. 12/09/20  Yes Rodriguez-Southworth, Nettie Elm, PA-C  azithromycin (ZITHROMAX) 250 MG tablet Take 1 tablet (250 mg total) by mouth daily. Take first 2 tablets together, then 1 every day until finished. 12/09/20  Yes Rodriguez-Southworth, Nettie Elm, PA-C  methylPREDNISolone (MEDROL DOSEPAK) 4 MG TBPK tablet Take as directed 12/09/20  Yes Rodriguez-Southworth, Nettie Elm, PA-C  escitalopram (LEXAPRO) 10 MG tablet Take 1 tablet (10 mg total) by mouth daily. For anxiety. 10/14/20   Doreene Nest, NP    Family History Family History  Problem Relation Age of Onset  . Diabetes Mother   . Depression Mother   .  Breast cancer Maternal Grandmother   . Diabetes Maternal Grandfather   . Lung cancer Paternal Grandmother     Social History Social History   Tobacco Use  . Smoking status: Former Games developer  . Smokeless tobacco: Never Used  Substance Use Topics  . Alcohol use: Yes    Alcohol/week: 6.0 standard drinks    Types: 6 Standard drinks or equivalent per week  . Drug use: No     Allergies   Patient has no known allergies.   Review of Systems Review of Systems  Constitutional: Positive for chills, fatigue and fever. Negative for appetite change and diaphoresis.  HENT: Positive for congestion, postnasal drip and rhinorrhea. Negative for ear discharge and ear pain.   Respiratory: Positive for cough and wheezing. Negative for chest tightness.        Cough attacks qhs  Musculoskeletal: Positive for myalgias.  Skin: Negative for rash.  Neurological: Negative for headaches.     Physical Exam Triage Vital Signs ED Triage Vitals  Enc Vitals Group     BP 12/09/20 1601 113/74     Pulse Rate 12/09/20 1601 78     Resp 12/09/20 1601 18     Temp 12/09/20 1601 98.5 F (36.9 C)     Temp Source 12/09/20 1601 Oral     SpO2 12/09/20 1601 95 %     Weight --      Height --  Head Circumference --      Peak Flow --      Pain Score 12/09/20 1621 3     Pain Loc --      Pain Edu? --      Excl. in GC? --    No data found.  Updated Vital Signs BP 113/74   Pulse 78   Temp 98.5 F (36.9 C) (Oral)   Resp 18   SpO2 95%   Visual Acuity Right Eye Distance:   Left Eye Distance:   Bilateral Distance:    Right Eye Near:   Left Eye Near:    Bilateral Near:     Physical Exam Physical Exam Constitutional:      General: He is not in acute distress.    Appearance: He is not toxic-appearing.  HENT:     Head: Normocephalic.     Right Ear: Tympanic membrane, ear canal and external ear normal.     Left Ear: Ear canal and external ear normal.     Nose: Nose normal.     Mouth/Throat:      Mouth: Mucous membranes are moist.     Pharynx: Oropharynx is clear.  Eyes:     General: No scleral icterus.    Conjunctiva/sclera: Conjunctivae normal.  Cardiovascular:     Rate and Rhythm: Normal rate and regular rhythm.     Heart sounds: No murmur heard.   Pulmonary:     Effort: Pulmonary effort is normal. No respiratory distress.     Breath sounds: Wheezing present.      Musculoskeletal:        General: Normal range of motion.     Cervical back: Neck supple.  Lymphadenopathy:     Cervical: No cervical adenopathy.  Skin:    General: Skin is warm and dry.     Findings: No rash.  Neurological:     Mental Status: He is alert and oriented to person, place, and time.     Gait: Gait normal.  Psychiatric:        Mood and Affect: Mood normal.        Behavior: Behavior normal.        Thought Content: Thought content normal.        Judgment: Judgment normal.     UC Treatments / Results  Labs (all labs ordered are listed, but only abnormal results are displayed) Labs Reviewed  POCT INFLUENZA A/B    EKG   Radiology DG Chest 2 View  Result Date: 12/09/2020 CLINICAL DATA:  Cough and wheezing EXAM: CHEST - 2 VIEW COMPARISON:  09/17/2007 FINDINGS: Mild bronchitic changes. No focal airspace disease or effusion. Normal cardiomediastinal silhouette. No pneumothorax. IMPRESSION: Mild bronchitic changes. No focal pulmonary infiltrate. Electronically Signed   By: Jasmine Pang M.D.   On: 12/09/2020 17:04    Procedures Procedures (including critical care time)  Medications Ordered in UC Medications - No data to display  Initial Impression / Assessment and Plan / UC Course  I have reviewed the triage vital signs and the nursing notes. Pertinent labs & imaging results that were available during my care of the patient were reviewed by me and considered in my medical decision making (see chart for details). More likely has allergic bronchitis, but due to aches I will cover him for  bacterial etiology as well. I placed him on Zpack, medrol dose pack and albuterol inhaler as noted. Needs to be seen if he gets worse.  Final Clinical Impressions(s) / UC Diagnoses  Final diagnoses:  Acute bronchitis, unspecified organism   Discharge Instructions   None    ED Prescriptions    Medication Sig Dispense Auth. Provider   azithromycin (ZITHROMAX) 250 MG tablet Take 1 tablet (250 mg total) by mouth daily. Take first 2 tablets together, then 1 every day until finished. 6 tablet Rodriguez-Southworth, Nettie Elm, PA-C   albuterol (VENTOLIN HFA) 108 (90 Base) MCG/ACT inhaler Inhale 2 puffs into the lungs every 4 (four) hours as needed for wheezing or shortness of breath. 18 g Rodriguez-Southworth, Mattalyn Anderegg, PA-C   methylPREDNISolone (MEDROL DOSEPAK) 4 MG TBPK tablet Take as directed 21 tablet Rodriguez-Southworth, Nettie Elm, PA-C     PDMP not reviewed this encounter.   Garey Ham, Cordelia Poche 12/09/20 2026

## 2020-12-09 NOTE — ED Triage Notes (Signed)
Productive cough with clear sputum, nasal congestion since Monday.

## 2021-03-27 ENCOUNTER — Other Ambulatory Visit: Payer: Self-pay

## 2021-03-28 ENCOUNTER — Encounter: Payer: Self-pay | Admitting: Primary Care

## 2021-03-28 ENCOUNTER — Other Ambulatory Visit: Payer: Self-pay

## 2021-03-28 ENCOUNTER — Ambulatory Visit (INDEPENDENT_AMBULATORY_CARE_PROVIDER_SITE_OTHER): Payer: 59 | Admitting: Primary Care

## 2021-03-28 VITALS — BP 110/64 | HR 72 | Temp 98.0°F | Ht 70.0 in | Wt 155.0 lb

## 2021-03-28 DIAGNOSIS — G43709 Chronic migraine without aura, not intractable, without status migrainosus: Secondary | ICD-10-CM

## 2021-03-28 MED ORDER — SUMATRIPTAN SUCCINATE 50 MG PO TABS: ORAL_TABLET | ORAL | 0 refills | Status: DC

## 2021-03-28 NOTE — Progress Notes (Signed)
Subjective:    Patient ID: Nathan Mccall, male    DOB: [birth year], 45 y.o.   MRN: [id number]  HPI  Nathan Mccall is a very pleasant 45 y.o. male with a history of anxiety, testosterone deficiency, tobacco abuse who presents today to discuss headaches.   History of migraines that dates back to teenage years, migraines typically occur 2-3 times annually, frequent headaches once weekly to every two weeks. Migraines are most located to the temporal regions. He does notice nausea without vomiting, sensitivity to light and sound.   His last migraine was two weeks ago, located to the left temporal region for which he woke up with, this lasted for a few hours. He took 800 mg of Ibuprofen, broke out in sweats, had nausea with vomiting. He proceeded to work that day, didn't feel well but did not leave work. This was his worst migraine he can recall.   He's never been treated for headache/migraines previously. He has a family history of migraines in his mother, brother, daughter. He denies a family history of brain tumor and brain aneurysm. He has no headache today.     Review of Systems  Eyes:  Positive for photophobia.  Cardiovascular:  Negative for chest pain.  Gastrointestinal:  Positive for nausea.  Neurological:  Positive for headaches. Negative for dizziness.        Past Medical History:  Diagnosis Date   GERD (gastroesophageal reflux disease)    Heart murmur     Social History   Socioeconomic History   Marital status: Married    Spouse name: Not on file   Number of children: Not on file   Years of education: Not on file   Highest education level: Not on file  Occupational History   Not on file  Tobacco Use   Smoking status: Former   Smokeless tobacco: Never  Substance and Sexual Activity   Alcohol use: Yes    Alcohol/week: 6.0 standard drinks    Types: 6 Standard drinks or equivalent per week   Drug use: No   Sexual activity: Yes  Other Topics Concern   Not on  file  Social History Narrative   Not on file   Social Determinants of Health   Financial Resource Strain: Not on file  Food Insecurity: Not on file  Transportation Needs: Not on file  Physical Activity: Not on file  Stress: Not on file  Social Connections: Not on file  Intimate Partner Violence: Not on file    Past Surgical History:  Procedure Laterality Date   APPENDECTOMY  1987   TONSILLECTOMY  1995    Family History  Problem Relation Age of Onset   Diabetes Mother    Depression Mother    Breast cancer Maternal Grandmother    Diabetes Maternal Grandfather    Lung cancer Paternal Grandmother     No Known Allergies  Current Outpatient Medications on File Prior to Visit  Medication Sig Dispense Refill   escitalopram (LEXAPRO) 10 MG tablet Take 1 tablet (10 mg total) by mouth daily. For anxiety. 90 tablet 3   No current facility-administered medications on file prior to visit.    BP 110/64   Pulse 72   Temp 98 F (36.7 C) (Temporal)   Ht 5\' 10"  (1.778 m)   Wt 155 lb (70.3 kg)   SpO2 96%   BMI 22.24 kg/m  Objective:   Physical Exam Eyes:     Extraocular Movements: Extraocular movements intact.  Cardiovascular:  Rate and Rhythm: Normal rate and regular rhythm.  Pulmonary:     Effort: Pulmonary effort is normal.     Breath sounds: Normal breath sounds. No wheezing or rales.  Musculoskeletal:     Cervical back: Neck supple.  Skin:    General: Skin is warm and dry.  Neurological:     Mental Status: He is alert and oriented to person, place, and time.     Cranial Nerves: No cranial nerve deficit.          Assessment & Plan:      This visit occurred during the SARS-CoV-2 public health emergency.  Safety protocols were in place, including screening questions prior to the visit, additional usage of staff PPE, and extensive cleaning of exam room while observing appropriate contact time as indicated for disinfecting solutions.

## 2021-03-28 NOTE — Patient Instructions (Signed)
You may take sumatriptan (Imitrex) 50 mg for migraines. Take 1 tablet by mouth at migraine onset, may repeat with additional tablet in 2 hours if headache persists or recurs.  We will see you in October!  It was a pleasure to see you today!

## 2021-03-28 NOTE — Assessment & Plan Note (Addendum)
Chronic for years, overall not bothersome until recently. Discussed options for treatment. He isn't bothered by his once weekly to every other week headaches, ibuprofen typically helps.  Given his recent severe migraine coupled with his migraine history, we agreed to send in abortive treatment. Rx for Imitrex 50 mg sent to pharmacy. Discussed instructions for use.   He will update if migraines/headaches become more frequent and/or if no improvement with treatment.  No alarm signs on exam or HPI.

## 2021-04-24 ENCOUNTER — Other Ambulatory Visit: Payer: Self-pay | Admitting: Primary Care

## 2021-04-24 DIAGNOSIS — G43709 Chronic migraine without aura, not intractable, without status migrainosus: Secondary | ICD-10-CM

## 2021-06-06 ENCOUNTER — Ambulatory Visit (INDEPENDENT_AMBULATORY_CARE_PROVIDER_SITE_OTHER): Payer: 59 | Admitting: Primary Care

## 2021-06-06 ENCOUNTER — Encounter: Payer: Self-pay | Admitting: Primary Care

## 2021-06-06 ENCOUNTER — Other Ambulatory Visit: Payer: Self-pay

## 2021-06-06 VITALS — BP 110/62 | HR 82 | Temp 98.6°F | Ht 70.0 in | Wt 155.0 lb

## 2021-06-06 DIAGNOSIS — R5382 Chronic fatigue, unspecified: Secondary | ICD-10-CM

## 2021-06-06 DIAGNOSIS — F411 Generalized anxiety disorder: Secondary | ICD-10-CM | POA: Diagnosis not present

## 2021-06-06 DIAGNOSIS — G43709 Chronic migraine without aura, not intractable, without status migrainosus: Secondary | ICD-10-CM | POA: Diagnosis not present

## 2021-06-06 DIAGNOSIS — E785 Hyperlipidemia, unspecified: Secondary | ICD-10-CM | POA: Diagnosis not present

## 2021-06-06 DIAGNOSIS — Z0000 Encounter for general adult medical examination without abnormal findings: Secondary | ICD-10-CM | POA: Insufficient documentation

## 2021-06-06 DIAGNOSIS — E559 Vitamin D deficiency, unspecified: Secondary | ICD-10-CM | POA: Diagnosis not present

## 2021-06-06 LAB — COMPREHENSIVE METABOLIC PANEL
ALT: 42 U/L (ref 0–53)
AST: 25 U/L (ref 0–37)
Albumin: 4.5 g/dL (ref 3.5–5.2)
Alkaline Phosphatase: 60 U/L (ref 39–117)
BUN: 10 mg/dL (ref 6–23)
CO2: 28 mEq/L (ref 19–32)
Calcium: 9.2 mg/dL (ref 8.4–10.5)
Chloride: 106 mEq/L (ref 96–112)
Creatinine, Ser: 0.8 mg/dL (ref 0.40–1.50)
GFR: 108.15 mL/min (ref >60.00)
Glucose, Bld: 104 mg/dL — ABNORMAL HIGH (ref 70–99)
Potassium: 4 mEq/L (ref 3.5–5.1)
Sodium: 139 mEq/L (ref 135–145)
Total Bilirubin: 0.5 mg/dL (ref 0.2–1.2)
Total Protein: 6.5 g/dL (ref 6.0–8.3)

## 2021-06-06 LAB — CBC
HCT: 43.3 % (ref 39.0–52.0)
Hemoglobin: 14.4 g/dL (ref 13.0–17.0)
MCHC: 33.2 g/dL (ref 30.0–36.0)
MCV: 92.2 fl (ref 78.0–100.0)
Platelets: 279 10*3/uL (ref 150.0–400.0)
RBC: 4.7 Mil/uL (ref 4.22–5.81)
RDW: 13.5 % (ref 11.5–15.5)
WBC: 6 10*3/uL (ref 4.0–10.5)

## 2021-06-06 LAB — LIPID PANEL
Cholesterol: 176 mg/dL (ref 0–200)
HDL: 38.1 mg/dL — ABNORMAL LOW (ref >39.00)
LDL Cholesterol: 118 mg/dL — ABNORMAL HIGH (ref 0–99)
NonHDL: 137.46
Total CHOL/HDL Ratio: 5
Triglycerides: 96 mg/dL (ref 0.0–149.0)
VLDL: 19.2 mg/dL (ref 0.0–40.0)

## 2021-06-06 NOTE — Progress Notes (Signed)
Subjective:    Patient ID: Nathan Mccall, male    DOB: 11-20-1975, 45 y.o.   MRN: [id number]  HPI  Nathan Mccall is a very pleasant 45 y.o. male who presents today for complete physical and follow up of chronic conditions.  Immunizations: -Tetanus: 2019 -Influenza: Declines  -Covid-19: 2 Covid vaccines   Diet: Improved diet.  Exercise: No regular exercise. Active.   Eye exam: Completes annually  Dental exam: Completes semi-annually   BP Readings from Last 3 Encounters:  06/06/21 110/62  03/28/21 110/64  12/09/20 113/74       Review of Systems  Constitutional:  Negative for unexpected weight change.  HENT:  Negative for rhinorrhea.   Eyes:  Negative for visual disturbance.  Respiratory:  Negative for cough and shortness of breath.   Cardiovascular:  Negative for chest pain.  Gastrointestinal:  Negative for constipation and diarrhea.  Genitourinary:  Negative for difficulty urinating.  Musculoskeletal:  Negative for arthralgias and myalgias.  Skin:  Negative for rash.  Allergic/Immunologic: Negative for environmental allergies.  Neurological:  Negative for dizziness and headaches.  Psychiatric/Behavioral:  The patient is not nervous/anxious.         Past Medical History:  Diagnosis Date   GERD (gastroesophageal reflux disease)    Heart murmur     Social History   Socioeconomic History   Marital status: Married    Spouse name: Not on file   Number of children: Not on file   Years of education: Not on file   Highest education level: Not on file  Occupational History   Not on file  Tobacco Use   Smoking status: Former   Smokeless tobacco: Never  Substance and Sexual Activity   Alcohol use: Yes    Alcohol/week: 6.0 standard drinks    Types: 6 Standard drinks or equivalent per week   Drug use: No   Sexual activity: Yes  Other Topics Concern   Not on file  Social History Narrative   Not on file   Social Determinants of Health   Financial  Resource Strain: Not on file  Food Insecurity: Not on file  Transportation Needs: Not on file  Physical Activity: Not on file  Stress: Not on file  Social Connections: Not on file  Intimate Partner Violence: Not on file    Past Surgical History:  Procedure Laterality Date   APPENDECTOMY  1987   TONSILLECTOMY  1995    Family History  Problem Relation Age of Onset   Diabetes Mother    Depression Mother    Breast cancer Maternal Grandmother    Diabetes Maternal Grandfather    Lung cancer Paternal Grandmother     No Known Allergies  Current Outpatient Medications on File Prior to Visit  Medication Sig Dispense Refill   escitalopram (LEXAPRO) 10 MG tablet Take 1 tablet (10 mg total) by mouth daily. For anxiety. 90 tablet 3   SUMAtriptan (IMITREX) 50 MG tablet Take 1 tablet by mouth at migraine onset, may repeat with additional tablet in 2 hours if headache persists or recurs. 10 tablet 0   No current facility-administered medications on file prior to visit.    BP 110/62   Pulse 82   Temp 98.6 F (37 C) (Temporal)   Ht 5\' 10"  (1.778 m)   Wt 155 lb (70.3 kg)   SpO2 97%   BMI 22.24 kg/m  Objective:   Physical Exam HENT:     Right Ear: Tympanic membrane and ear canal normal.  Left Ear: Tympanic membrane and ear canal normal.     Nose: Nose normal.     Right Sinus: No maxillary sinus tenderness or frontal sinus tenderness.     Left Sinus: No maxillary sinus tenderness or frontal sinus tenderness.  Eyes:     Conjunctiva/sclera: Conjunctivae normal.  Neck:     Thyroid: No thyromegaly.     Vascular: No carotid bruit.  Cardiovascular:     Rate and Rhythm: Normal rate and regular rhythm.     Heart sounds: Normal heart sounds.  Pulmonary:     Effort: Pulmonary effort is normal.     Breath sounds: Normal breath sounds. No wheezing or rales.  Abdominal:     General: Bowel sounds are normal.     Palpations: Abdomen is soft.     Tenderness: There is no abdominal  tenderness.  Musculoskeletal:        General: Normal range of motion.     Cervical back: Neck supple.  Skin:    General: Skin is warm and dry.  Neurological:     Mental Status: He is alert and oriented to person, place, and time.     Cranial Nerves: No cranial nerve deficit.     Deep Tendon Reflexes: Reflexes are normal and symmetric.  Psychiatric:        Mood and Affect: Mood normal.          Assessment & Plan:      This visit occurred during the SARS-CoV-2 public health emergency.  Safety protocols were in place, including screening questions prior to the visit, additional usage of staff PPE, and extensive cleaning of exam room while observing appropriate contact time as indicated for disinfecting solutions.

## 2021-06-06 NOTE — Assessment & Plan Note (Signed)
Doing well on lexapro 10 mg, continue same.

## 2021-06-06 NOTE — Assessment & Plan Note (Addendum)
Well controlled, infrequent migraines.  Continue sumatriptan 50 mg PRN.

## 2021-06-06 NOTE — Assessment & Plan Note (Signed)
Resolved. Continue vitamins and healthier diet.

## 2021-06-06 NOTE — Patient Instructions (Signed)
Stop by the lab prior to leaving today. I will notify you of your results once received.   It was a pleasure to see you today!  Preventive Care 40-44 Years Old, Male Preventive care refers to lifestyle choices and visits with your health care provider that can promote health and wellness. This includes: A yearly physical exam. This is also called an annual wellness visit. Regular dental and eye exams. Immunizations. Screening for certain conditions. Healthy lifestyle choices, such as: Eating a healthy diet. Getting regular exercise. Not using drugs or products that contain nicotine and tobacco. Limiting alcohol use. What can I expect for my preventive care visit? Physical exam Your health care provider will check your: Height and weight. These may be used to calculate your BMI (body mass index). BMI is a measurement that tells if you are at a healthy weight. Heart rate and blood pressure. Body temperature. Skin for abnormal spots. Counseling Your health care provider may ask you questions about your: Past medical problems. Family's medical history. Alcohol, tobacco, and drug use. Emotional well-being. Home life and relationship well-being. Sexual activity. Diet, exercise, and sleep habits. Work and work environment. Access to firearms. What immunizations do I need? Vaccines are usually given at various ages, according to a schedule. Your health care provider will recommend vaccines for you based on your age, medical history, and lifestyle or other factors, such as travel or where you work. What tests do I need? Blood tests Lipid and cholesterol levels. These may be checked every 5 years, or more often if you are over 50 years old. Hepatitis C test. Hepatitis B test. Screening Lung cancer screening. You may have this screening every year starting at age 55 if you have a 30-pack-year history of smoking and currently smoke or have quit within the past 15 years. Prostate cancer  screening. Recommendations will vary depending on your family history and other risks. Genital exam to check for testicular cancer or hernias. Colorectal cancer screening. All adults should have this screening starting at age 50 and continuing until age 75. Your health care provider may recommend screening at age 45 if you are at increased risk. You will have tests every 1-10 years, depending on your results and the type of screening test. Diabetes screening. This is done by checking your blood sugar (glucose) after you have not eaten for a while (fasting). You may have this done every 1-3 years. STD (sexually transmitted disease) testing, if you are at risk. Follow these instructions at home: Eating and drinking  Eat a diet that includes fresh fruits and vegetables, whole grains, lean protein, and low-fat dairy products. Take vitamin and mineral supplements as recommended by your health care provider. Do not drink alcohol if your health care provider tells you not to drink. If you drink alcohol: Limit how much you have to 0-2 drinks a day. Be aware of how much alcohol is in your drink. In the [place] one drink equals one 12 oz bottle of beer (355 mL), one 5 oz glass of wine (148 mL), or one 1 oz glass of hard liquor (44 mL). Lifestyle Take daily care of your teeth and gums. Brush your teeth every morning and night with fluoride toothpaste. Floss one time each day. Stay active. Exercise for at least 30 minutes 5 or more days each week. Do not use any products that contain nicotine or tobacco, such as cigarettes, e-cigarettes, and chewing tobacco. If you need help quitting, ask your health care provider. Do not   use drugs. If you are sexually active, practice safe sex. Use a condom or other form of protection to prevent STIs (sexually transmitted infections). If told by your health care provider, take low-dose aspirin daily starting at age 50. Find healthy ways to cope with stress, such  as: Meditation, yoga, or listening to music. Journaling. Talking to a trusted person. Spending time with friends and family. Safety Always wear your seat belt while driving or riding in a vehicle. Do not drive: If you have been drinking alcohol. Do not ride with someone who has been drinking. When you are tired or distracted. While texting. Wear a helmet and other protective equipment during sports activities. If you have firearms in your house, make sure you follow all gun safety procedures. What's next? Go to your health care provider once a year for an annual wellness visit. Ask your health care provider how often you should have your eyes and teeth checked. Stay up to date on all vaccines. This information is not intended to replace advice given to you by your health care provider. Make sure you discuss any questions you have with your health care provider. Document Revised: 10/14/2020 Document Reviewed: 07/31/2018 Elsevier Patient Education  2022 [employer] Inc[employer]  

## 2021-06-06 NOTE — Assessment & Plan Note (Signed)
Tetanus UTD. Declines influenza vaccine. Discussed the importance of a healthy diet and regular exercise in order for weight loss, and to reduce the risk of further co-morbidity.  Exam today stable. Labs pending.

## 2021-06-06 NOTE — Assessment & Plan Note (Signed)
Above goal last year. Commended him on dietary changes.   Repeat lipid panel pending.  Not on treatment.

## 2021-06-06 NOTE — Assessment & Plan Note (Signed)
Compliant to vitamin D daily, continue same.  Fatigue has significantly improved.

## 2021-10-04 ENCOUNTER — Other Ambulatory Visit: Payer: Self-pay | Admitting: Primary Care

## 2021-10-04 DIAGNOSIS — F411 Generalized anxiety disorder: Secondary | ICD-10-CM

## 2022-03-15 IMAGING — DX DG CHEST 2V
2 series · 2 of 2 positions shown · non-contrast
Comparison: 09/17/2007

CLINICAL DATA: Cough and wheezing

EXAM:
CHEST - 2 VIEW

[chest pa]
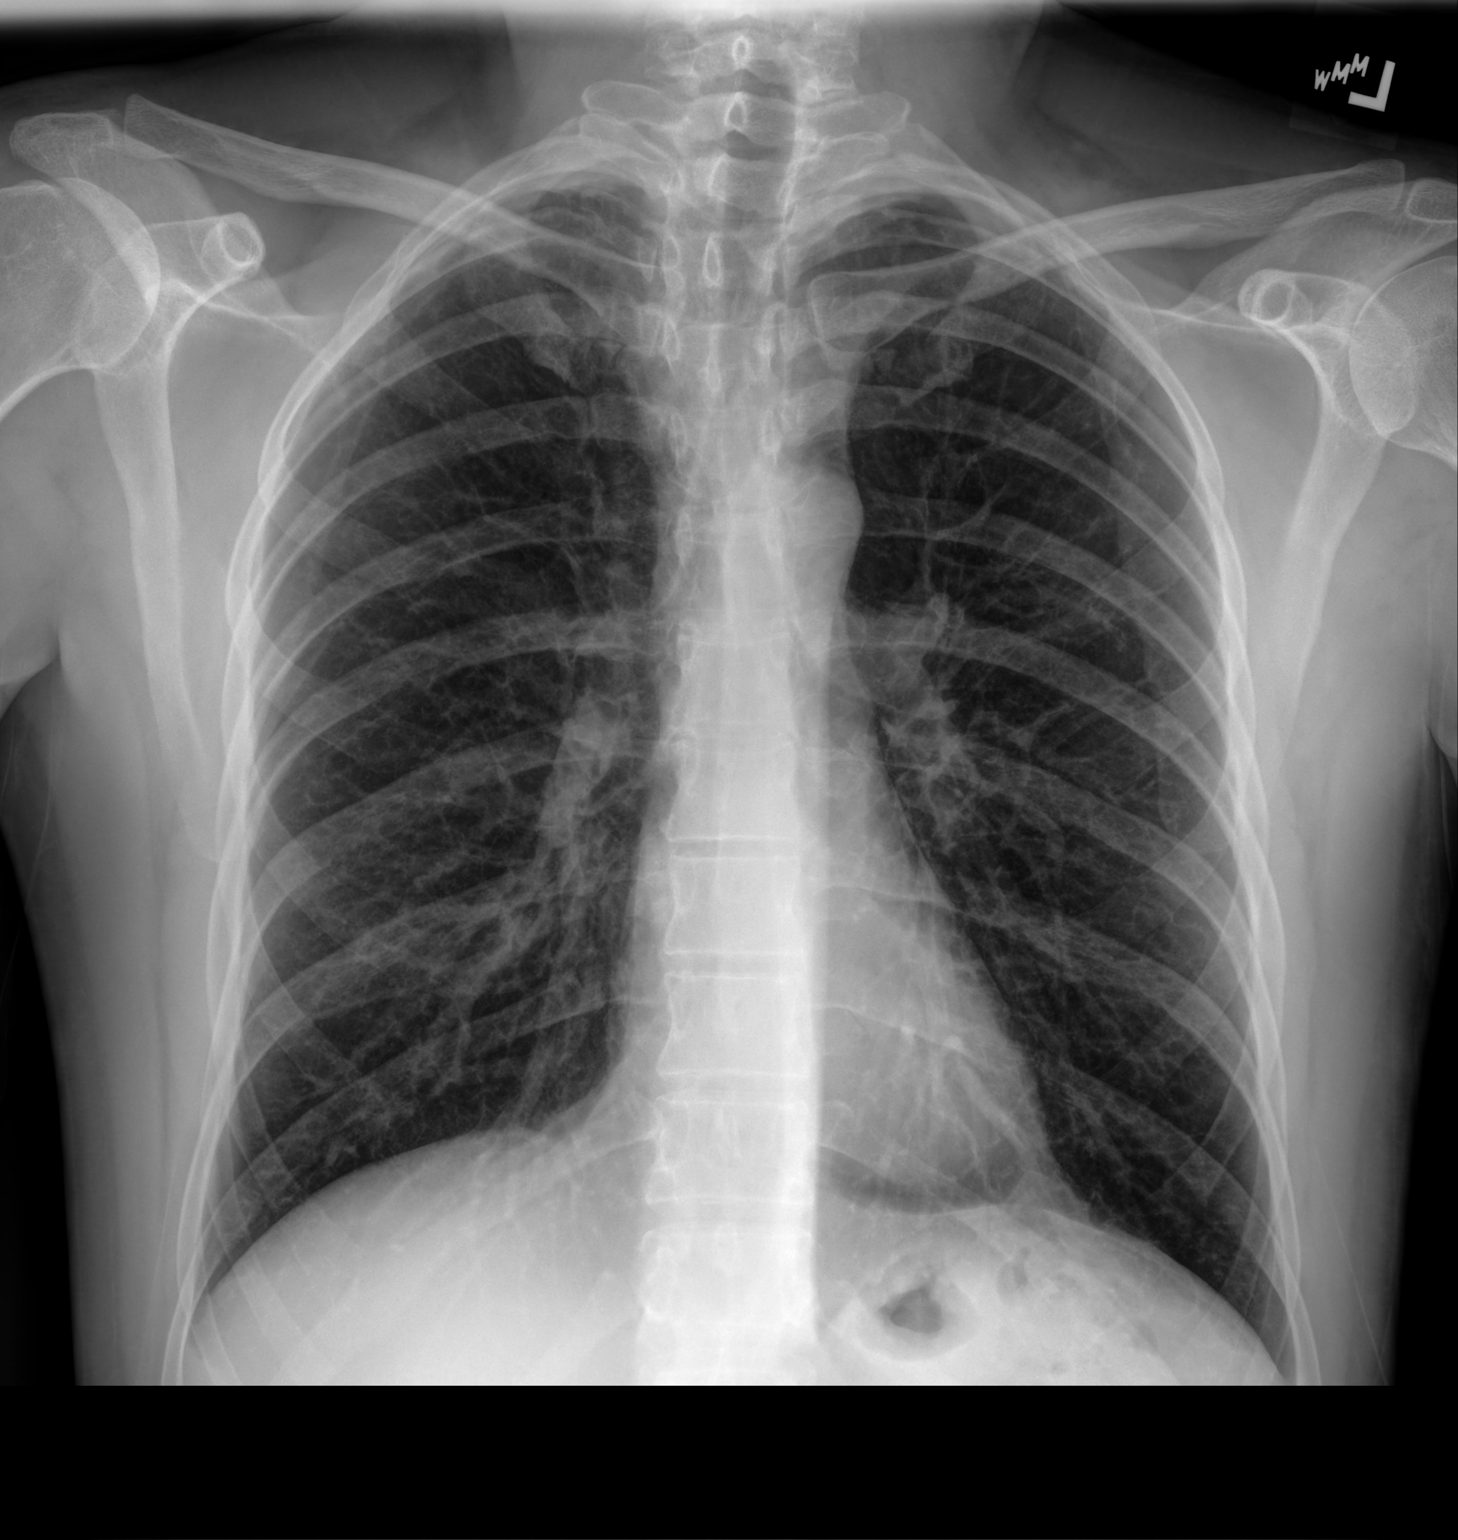

[chest lat]
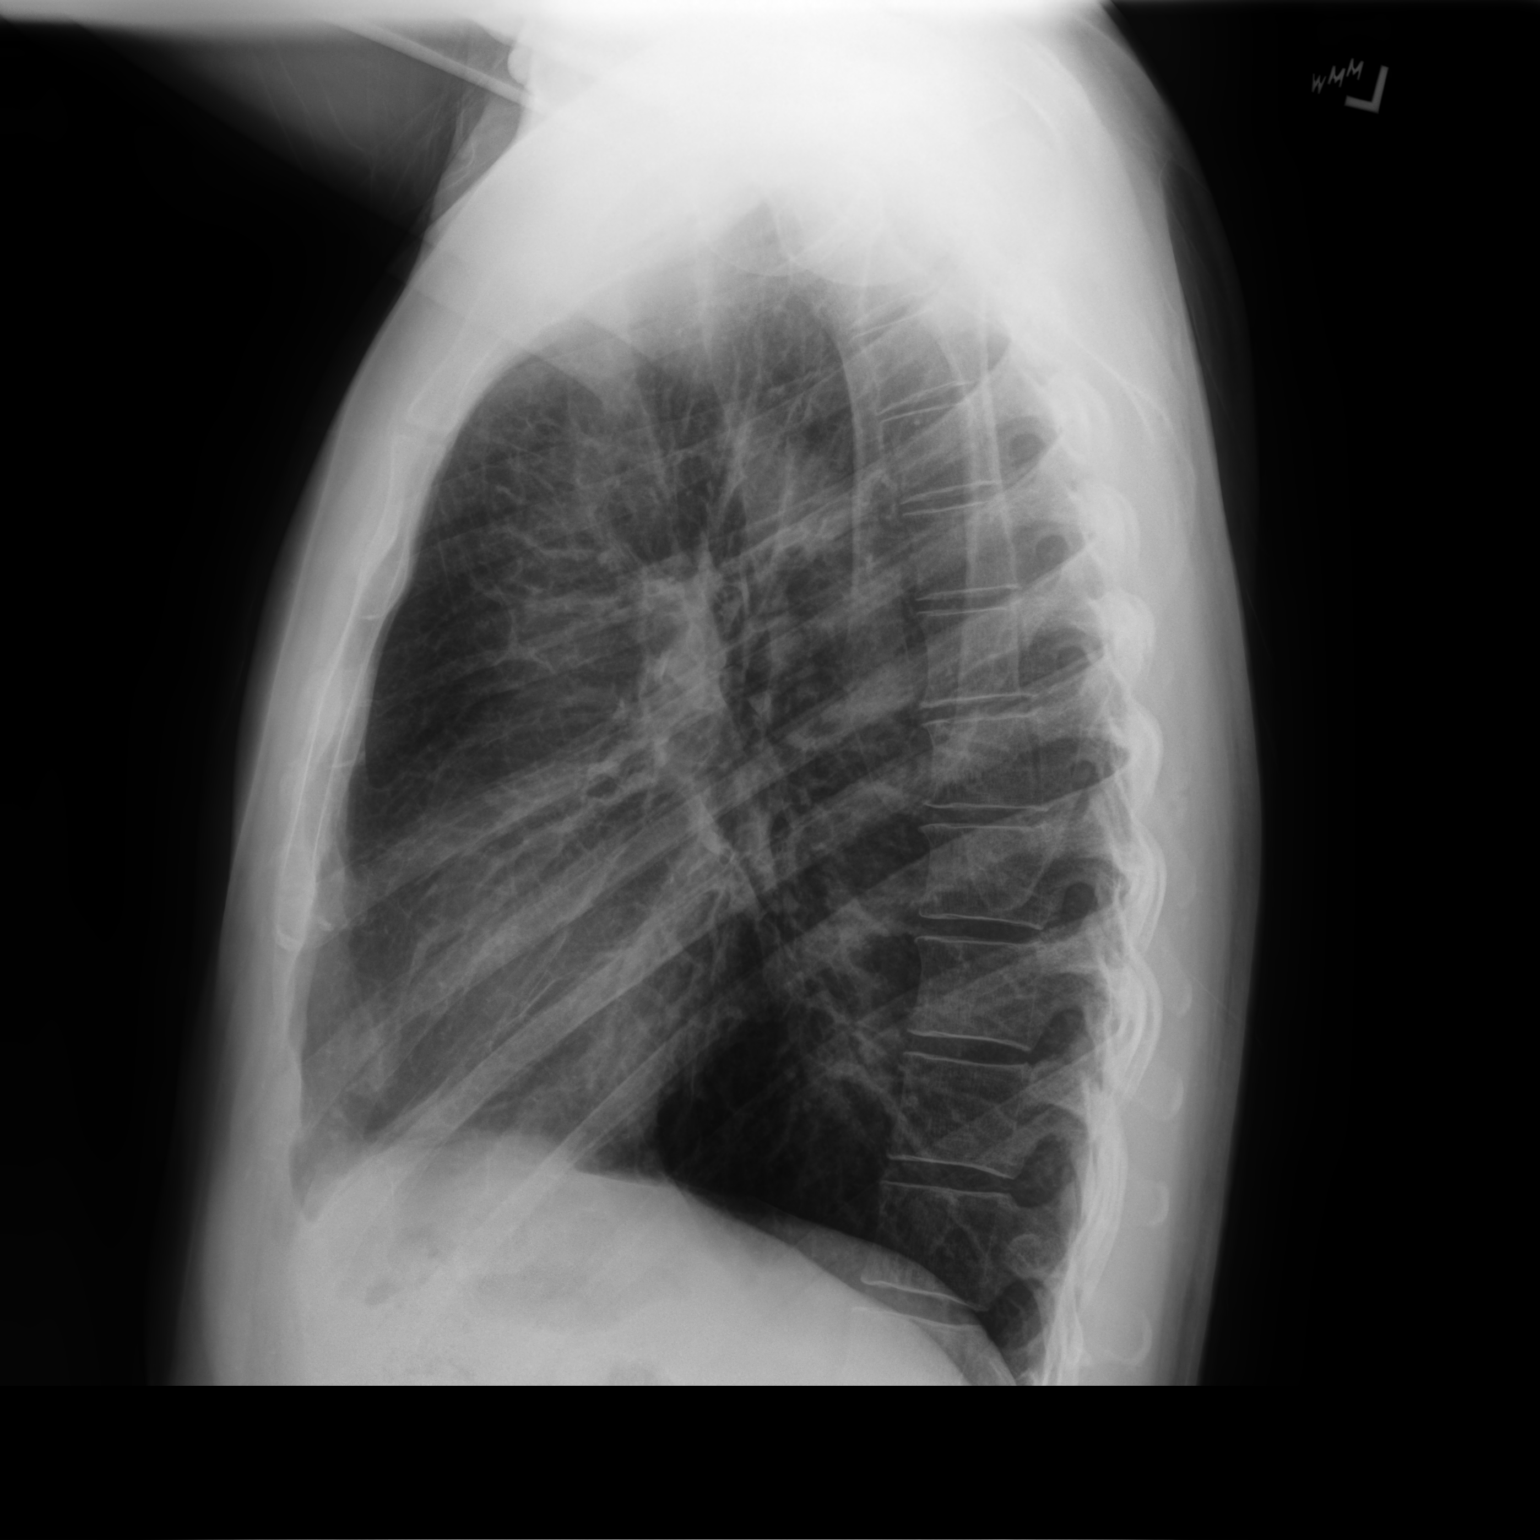

[2 of 2 positions shown; findings below may reference images not displayed]

FINDINGS: Mild bronchitic changes. No focal airspace disease or effusion.
Normal cardiomediastinal silhouette. No pneumothorax.
IMPRESSION: Mild bronchitic changes. No focal pulmonary infiltrate.

## 2022-04-02 ENCOUNTER — Other Ambulatory Visit: Payer: Self-pay | Admitting: Primary Care

## 2022-04-02 DIAGNOSIS — F411 Generalized anxiety disorder: Secondary | ICD-10-CM

## 2022-04-02 NOTE — Telephone Encounter (Signed)
Patient is due for CPE/follow up for late October, this will be required prior to any further refills.  Please schedule.

## 2022-04-09 NOTE — Telephone Encounter (Signed)
Patient scheduled.

## 2022-04-13 ENCOUNTER — Encounter (INDEPENDENT_AMBULATORY_CARE_PROVIDER_SITE_OTHER): Payer: Self-pay

## 2022-04-13 DIAGNOSIS — Z87898 Personal history of other specified conditions: Secondary | ICD-10-CM

## 2022-04-15 MED ORDER — SCOPOLAMINE 1 MG/3DAYS TD PT72: MEDICATED_PATCH | TRANSDERMAL | 0 refills | Status: DC

## 2022-04-15 NOTE — Telephone Encounter (Signed)
Please see the MyChart message reply(ies) for my assessment and plan.  The patient gave consent for this Medical Advice Message and is aware that it may result in a bill to their insurance company as well as the possibility that this may result in a co-payment or deductible. They are an established patient, but are not seeking medical advice exclusively about a problem treated during an in person or video visit in the last 7 days. I did not recommend an in person or video visit within 7 days of my reply.  I spent a total of 10 minutes cumulative time within 7 days through MyChart messaging Debbrah Sampedro K Kalyb Pemble, NP  

## 2022-06-12 ENCOUNTER — Encounter: Payer: Self-pay | Admitting: Primary Care

## 2022-06-13 ENCOUNTER — Ambulatory Visit (INDEPENDENT_AMBULATORY_CARE_PROVIDER_SITE_OTHER): Payer: Commercial Managed Care - HMO | Admitting: Primary Care

## 2022-06-13 ENCOUNTER — Encounter: Payer: Self-pay | Admitting: Primary Care

## 2022-06-13 VITALS — BP 110/66 | HR 70 | Temp 97.9°F | Ht 70.0 in | Wt 147.0 lb

## 2022-06-13 DIAGNOSIS — Z0000 Encounter for general adult medical examination without abnormal findings: Secondary | ICD-10-CM

## 2022-06-13 DIAGNOSIS — Z87898 Personal history of other specified conditions: Secondary | ICD-10-CM | POA: Insufficient documentation

## 2022-06-13 DIAGNOSIS — F411 Generalized anxiety disorder: Secondary | ICD-10-CM | POA: Diagnosis not present

## 2022-06-13 DIAGNOSIS — E785 Hyperlipidemia, unspecified: Secondary | ICD-10-CM

## 2022-06-13 DIAGNOSIS — E559 Vitamin D deficiency, unspecified: Secondary | ICD-10-CM

## 2022-06-13 DIAGNOSIS — G43709 Chronic migraine without aura, not intractable, without status migrainosus: Secondary | ICD-10-CM | POA: Diagnosis not present

## 2022-06-13 DIAGNOSIS — R5382 Chronic fatigue, unspecified: Secondary | ICD-10-CM

## 2022-06-13 LAB — COMPREHENSIVE METABOLIC PANEL
ALT: 40 U/L (ref 0–53)
AST: 25 U/L (ref 0–37)
Albumin: 4.5 g/dL (ref 3.5–5.2)
Alkaline Phosphatase: 60 U/L (ref 39–117)
BUN: 12 mg/dL (ref 6–23)
CO2: 31 mEq/L (ref 19–32)
Calcium: 9.7 mg/dL (ref 8.4–10.5)
Chloride: 105 mEq/L (ref 96–112)
Creatinine, Ser: 0.74 mg/dL (ref 0.40–1.50)
GFR: 109.94 mL/min (ref >60.00)
Glucose, Bld: 88 mg/dL (ref 70–99)
Potassium: 4.5 mEq/L (ref 3.5–5.1)
Sodium: 140 mEq/L (ref 135–145)
Total Bilirubin: 0.5 mg/dL (ref 0.2–1.2)
Total Protein: 6.7 g/dL (ref 6.0–8.3)

## 2022-06-13 LAB — LIPID PANEL
Cholesterol: 187 mg/dL (ref 0–200)
HDL: 43.1 mg/dL (ref >39.00)
LDL Cholesterol: 131 mg/dL — ABNORMAL HIGH (ref 0–99)
NonHDL: 144.34
Total CHOL/HDL Ratio: 4
Triglycerides: 68 mg/dL (ref 0.0–149.0)
VLDL: 13.6 mg/dL (ref 0.0–40.0)

## 2022-06-13 NOTE — Assessment & Plan Note (Signed)
Immunizations UTD. Declines influenza vaccine.   Discussed the importance of a healthy diet and regular exercise in order for weight loss, and to reduce the risk of further co-morbidity.  Exam stable. Labs pending.  Follow up in 1 year for repeat physical.  

## 2022-06-13 NOTE — Assessment & Plan Note (Signed)
Significantly improved. Continue vitamin supplementation.

## 2022-06-13 NOTE — Patient Instructions (Signed)
Stop by the lab prior to leaving today. I will notify you of your results once received.   It was a pleasure to see you today!  Preventive Care 40-45 Years Old, Male Preventive care refers to lifestyle choices and visits with your health care provider that can promote health and wellness. Preventive care visits are also called wellness exams. What can I expect for my preventive care visit? Counseling During your preventive care visit, your health care provider may ask about your: Medical history, including: Past medical problems. Family medical history. Current health, including: Emotional well-being. Home life and relationship well-being. Sexual activity. Lifestyle, including: Alcohol, nicotine or tobacco, and drug use. Access to firearms. Diet, exercise, and sleep habits. Safety issues such as seatbelt and bike helmet use. Sunscreen use. Work and work environment. Physical exam Your health care provider will check your: Height and weight. These may be used to calculate your BMI (body mass index). BMI is a measurement that tells if you are at a healthy weight. Waist circumference. This measures the distance around your waistline. This measurement also tells if you are at a healthy weight and may help predict your risk of certain diseases, such as type 2 diabetes and high blood pressure. Heart rate and blood pressure. Body temperature. Skin for abnormal spots. What immunizations do I need?  Vaccines are usually given at various ages, according to a schedule. Your health care provider will recommend vaccines for you based on your age, medical history, and lifestyle or other factors, such as travel or where you work. What tests do I need? Screening Your health care provider may recommend screening tests for certain conditions. This may include: Lipid and cholesterol levels. Diabetes screening. This is done by checking your blood sugar (glucose) after you have not eaten for a while  (fasting). Hepatitis B test. Hepatitis C test. HIV (human immunodeficiency virus) test. STI (sexually transmitted infection) testing, if you are at risk. Lung cancer screening. Prostate cancer screening. Colorectal cancer screening. Talk with your health care provider about your test results, treatment options, and if necessary, the need for more tests. Follow these instructions at home: Eating and drinking  Eat a diet that includes fresh fruits and vegetables, whole grains, lean protein, and low-fat dairy products. Take vitamin and mineral supplements as recommended by your health care provider. Do not drink alcohol if your health care provider tells you not to drink. If you drink alcohol: Limit how much you have to 0-2 drinks a day. Know how much alcohol is in your drink. In the [place] one drink equals one 12 oz bottle of beer (355 mL), one 5 oz glass of wine (148 mL), or one 1 oz glass of hard liquor (44 mL). Lifestyle Brush your teeth every morning and night with fluoride toothpaste. Floss one time each day. Exercise for at least 30 minutes 5 or more days each week. Do not use any products that contain nicotine or tobacco. These products include cigarettes, chewing tobacco, and vaping devices, such as e-cigarettes. If you need help quitting, ask your health care provider. Do not use drugs. If you are sexually active, practice safe sex. Use a condom or other form of protection to prevent STIs. Take aspirin only as told by your health care provider. Make sure that you understand how much to take and what form to take. Work with your health care provider to find out whether it is safe and beneficial for you to take aspirin daily. Find healthy ways to manage   stress, such as: Meditation, yoga, or listening to music. Journaling. Talking to a trusted person. Spending time with friends and family. Minimize exposure to UV radiation to reduce your risk of skin cancer. Safety Always wear  your seat belt while driving or riding in a vehicle. Do not drive: If you have been drinking alcohol. Do not ride with someone who has been drinking. When you are tired or distracted. While texting. If you have been using any mind-altering substances or drugs. Wear a helmet and other protective equipment during sports activities. If you have firearms in your house, make sure you follow all gun safety procedures. What's next? Go to your health care provider once a year for an annual wellness visit. Ask your health care provider how often you should have your eyes and teeth checked. Stay up to date on all vaccines. This information is not intended to replace advice given to you by your health care provider. Make sure you discuss any questions you have with your health care provider. Document Revised: 02/01/2021 Document Reviewed: 02/01/2021 Elsevier Patient Education  2023 [employer]  

## 2022-06-13 NOTE — Assessment & Plan Note (Signed)
Resolved.  Continue vitamin D supplements.

## 2022-06-13 NOTE — Assessment & Plan Note (Signed)
Discussed the importance of a healthy diet and regular exercise in order for weight loss, and to reduce the risk of further co-morbidity.  Repeat lipid panel panel pending.

## 2022-06-13 NOTE — Progress Notes (Signed)
Subjective:    Patient ID: Nathan Mccall, male    DOB: [birth year], 46 y.o.   MRN: [id number]  HPI  Nathan Mccall is a very pleasant 46 y.o. male who presents today for complete physical and follow up of chronic conditions.  Immunizations: -Tetanus: 2019 -Influenza: Declines today  Diet: Fair diet.  Exercise: No regular exercise.  Eye exam: Completes annually  Dental exam: Completes semi-annually   BP Readings from Last 3 Encounters:  06/13/22 110/66  06/06/21 110/62  03/28/21 110/64         Review of Systems  Constitutional:  Negative for unexpected weight change.  HENT:  Negative for rhinorrhea.   Respiratory:  Negative for cough and shortness of breath.   Cardiovascular:  Negative for chest pain.  Gastrointestinal:  Negative for constipation and diarrhea.  Genitourinary:  Negative for difficulty urinating.  Musculoskeletal:  Negative for arthralgias and myalgias.  Skin:  Negative for rash.  Allergic/Immunologic: Negative for environmental allergies.  Neurological:  Negative for dizziness and headaches.  Psychiatric/Behavioral:  The patient is not nervous/anxious.          Past Medical History:  Diagnosis Date   GERD (gastroesophageal reflux disease)    Heart murmur     Social History   Socioeconomic History   Marital status: Married    Spouse name: Not on file   Number of children: Not on file   Years of education: Not on file   Highest education level: Not on file  Occupational History   Not on file  Tobacco Use   Smoking status: Former   Smokeless tobacco: Never  Substance and Sexual Activity   Alcohol use: Yes    Alcohol/week: 6.0 standard drinks of alcohol    Types: 6 Standard drinks or equivalent per week   Drug use: No   Sexual activity: Yes  Other Topics Concern   Not on file  Social History Narrative   Not on file   Social Determinants of Health   Financial Resource Strain: Not on file  Food Insecurity: Not on file   Transportation Needs: Not on file  Physical Activity: Not on file  Stress: Not on file  Social Connections: Not on file  Intimate Partner Violence: Not on file    Past Surgical History:  Procedure Laterality Date   Strawberry    Family History  Problem Relation Age of Onset   Diabetes Mother    Depression Mother    Breast cancer Maternal Grandmother    Diabetes Maternal Grandfather    Lung cancer Paternal Grandmother     No Known Allergies  Current Outpatient Medications on File Prior to Visit  Medication Sig Dispense Refill   escitalopram (LEXAPRO) 10 MG tablet Take 1 tablet (10 mg total) by mouth daily. For anxiety. Office visit due in late October. 90 tablet 0   scopolamine (TRANSDERM-SCOP) 1 MG/3DAYS Apply 1 patch to a hairless area of your body 4 hours prior to trip start.  Replace patch every 3 days. 10 patch 0   SUMAtriptan (IMITREX) 50 MG tablet Take 1 tablet by mouth at migraine onset, may repeat with additional tablet in 2 hours if headache persists or recurs. 10 tablet 0   No current facility-administered medications on file prior to visit.    BP 110/66   Pulse 70   Temp 97.9 F (36.6 C) (Temporal)   Ht 5\' 10"  (1.778 m)   Wt 147 lb (66.7 kg)  SpO2 98%   BMI 21.09 kg/m  Objective:   Physical Exam HENT:     Right Ear: Tympanic membrane and ear canal normal.     Left Ear: Tympanic membrane and ear canal normal.     Nose: Nose normal.     Right Sinus: No maxillary sinus tenderness or frontal sinus tenderness.     Left Sinus: No maxillary sinus tenderness or frontal sinus tenderness.  Eyes:     Conjunctiva/sclera: Conjunctivae normal.  Neck:     Thyroid: No thyromegaly.     Vascular: No carotid bruit.  Cardiovascular:     Rate and Rhythm: Normal rate and regular rhythm.     Heart sounds: Normal heart sounds.  Pulmonary:     Effort: Pulmonary effort is normal.     Breath sounds: Normal breath sounds. No wheezing or  rales.  Abdominal:     General: Bowel sounds are normal.     Palpations: Abdomen is soft.     Tenderness: There is no abdominal tenderness.  Musculoskeletal:        General: Normal range of motion.     Cervical back: Neck supple.  Skin:    General: Skin is warm and dry.  Neurological:     Mental Status: He is alert and oriented to person, place, and time.     Cranial Nerves: No cranial nerve deficit.     Deep Tendon Reflexes: Reflexes are normal and symmetric.  Psychiatric:        Mood and Affect: Mood normal.           Assessment & Plan:   Problem List Items Addressed This Visit       Cardiovascular and Mediastinum   Chronic migraine without aura without status migrainosus, not intractable    Controlled.  Continue sumatriptan 50 mg PRN.          Other   Fatigue    Significantly improved. Continue vitamin supplementation.      GAD (generalized anxiety disorder)    Controlled.  Continue Lexapro 10 mg daily. Continue to monitor.       Vitamin D deficiency    Resolved.  Continue vitamin D supplements.      Preventative health care - Primary    Immunizations UTD. Declines influenza vaccine.  Discussed the importance of a healthy diet and regular exercise in order for weight loss, and to reduce the risk of further co-morbidity.  Exam stable. Labs pending.  Follow up in 1 year for repeat physical.       Hyperlipidemia    Discussed the importance of a healthy diet and regular exercise in order for weight loss, and to reduce the risk of further co-morbidity.  Repeat lipid panel panel pending.      Relevant Orders   Lipid panel   Comprehensive metabolic panel   History of motion sickness    With boat rides/fishing trips.  Continue scopolamine patches PRN.          Doreene Nest, NP

## 2022-06-13 NOTE — Assessment & Plan Note (Signed)
Controlled.  Continue sumatriptan 50 mg PRN.  

## 2022-06-13 NOTE — Assessment & Plan Note (Signed)
With boat rides/fishing trips.  Continue scopolamine patches PRN.

## 2022-06-13 NOTE — Assessment & Plan Note (Signed)
Controlled.  Continue Lexapro 10 mg daily. Continue to monitor.

## 2022-06-30 ENCOUNTER — Other Ambulatory Visit: Payer: Self-pay | Admitting: Primary Care

## 2022-06-30 DIAGNOSIS — F411 Generalized anxiety disorder: Secondary | ICD-10-CM

## 2022-10-16 ENCOUNTER — Ambulatory Visit: Admit: 2022-10-16 | Payer: Self-pay

## 2022-10-16 DIAGNOSIS — J029 Acute pharyngitis, unspecified: Secondary | ICD-10-CM | POA: Diagnosis not present

## 2022-10-16 DIAGNOSIS — J019 Acute sinusitis, unspecified: Secondary | ICD-10-CM | POA: Diagnosis not present

## 2023-04-10 ENCOUNTER — Ambulatory Visit: Payer: Self-pay | Admitting: Primary Care

## 2023-04-10 ENCOUNTER — Encounter: Payer: Self-pay | Admitting: Primary Care

## 2023-04-10 VITALS — BP 110/62 | HR 61 | Temp 97.6°F | Ht 70.0 in | Wt 148.0 lb

## 2023-04-10 DIAGNOSIS — G43709 Chronic migraine without aura, not intractable, without status migrainosus: Secondary | ICD-10-CM

## 2023-04-10 DIAGNOSIS — L219 Seborrheic dermatitis, unspecified: Secondary | ICD-10-CM | POA: Insufficient documentation

## 2023-04-10 DIAGNOSIS — Z1211 Encounter for screening for malignant neoplasm of colon: Secondary | ICD-10-CM | POA: Diagnosis not present

## 2023-04-10 MED ORDER — SUMATRIPTAN SUCCINATE 50 MG PO TABS: ORAL_TABLET | ORAL | 0 refills | Status: AC

## 2023-04-10 MED ORDER — KETOCONAZOLE 2 % EX CREA: 1.0000 | TOPICAL_CREAM | Freq: Two times a day (BID) | CUTANEOUS | 0 refills | Status: DC | PRN

## 2023-04-10 NOTE — Patient Instructions (Signed)
Apply ketoconazole cream twice daily for a few weeks.  You can use Selsun Blue shampoo a few times weekly for maintenance after the rash resolves.  It was a pleasure to see you today!

## 2023-04-10 NOTE — Assessment & Plan Note (Signed)
Exam today representative of mild case of seborrheic dermatitis.  Treat with ketoconazole 2% cream twice daily as needed x 4 weeks.  We discussed maintenance with Selsun Blue shampoo.  He will update if no improvement.

## 2023-04-10 NOTE — Progress Notes (Signed)
Subjective:    Patient ID: Nathan Mccall, male    DOB: Apr 16, 1976, 47 y.o.   MRN: [id number]  Rash    Nathan Mccall is a very pleasant 47 y.o. male with a history of migraines, GAD, testosterone deficiency, hyperlipidemia who presents today to discuss rash.  He is also needing refills of sumatriptan for migraines.   His rash is located to the bilateral sides of the nose down to the upper oral cavity. His rash gets "red and raw", peels/becomes scaly. His rash initially began about a year ago and is intermittent.   He's applied Eucerin, Aquaphor, Vaseline, neosporin, vitamin E without improvement.  He denies itching, rashes elsewhere to his body, changes in mediations/soaps/detergents/facial cleanser.   No one else in the house has the rash.   Today his rash is mild, a few days ago it was more intense.  Symptoms typically wax and wane.   Review of Systems  Skin:  Positive for rash.  Neurological:  Negative for numbness.         Past Medical History:  Diagnosis Date   GERD (gastroesophageal reflux disease)    Heart murmur     Social History   Socioeconomic History   Marital status: Married    Spouse name: Not on file   Number of children: Not on file   Years of education: Not on file   Highest education level: Not on file  Occupational History   Not on file  Tobacco Use   Smoking status: Former   Smokeless tobacco: Never  Substance and Sexual Activity   Alcohol use: Yes    Alcohol/week: 6.0 standard drinks of alcohol    Types: 6 Standard drinks or equivalent per week   Drug use: No   Sexual activity: Yes  Other Topics Concern   Not on file  Social History Narrative   Not on file   Social Determinants of Health   Financial Resource Strain: Not on file  Food Insecurity: Not on file  Transportation Needs: Not on file  Physical Activity: Not on file  Stress: Not on file  Social Connections: Not on file  Intimate Partner Violence: Not on file     Past Surgical History:  Procedure Laterality Date   APPENDECTOMY  1987   TONSILLECTOMY  1995    Family History  Problem Relation Age of Onset   Diabetes Mother    Depression Mother    Breast cancer Maternal Grandmother    Diabetes Maternal Grandfather    Lung cancer Paternal Grandmother     No Known Allergies  Current Outpatient Medications on File Prior to Visit  Medication Sig Dispense Refill   escitalopram (LEXAPRO) 10 MG tablet Take 1 tablet (10 mg total) by mouth daily. For anxiety. 90 tablet 3   scopolamine (TRANSDERM-SCOP) 1 MG/3DAYS Apply 1 patch to a hairless area of your body 4 hours prior to trip start.  Replace patch every 3 days. 10 patch 0   No current facility-administered medications on file prior to visit.    BP 110/62   Pulse 61   Temp 97.6 F (36.4 C) (Temporal)   Ht 5\' 10"  (1.778 m)   Wt 148 lb (67.1 kg)   SpO2 98%   BMI 21.24 kg/m  Objective:   Physical Exam Constitutional:      General: He is not in acute distress. Cardiovascular:     Rate and Rhythm: Normal rate.  Pulmonary:     Effort: Pulmonary effort is normal.  Skin:    General: Skin is warm and dry.     Findings: Rash present.     Comments: Mild erythema to each side of the nose and each side of the upper portion of the oral cavity.  No scaling.           Assessment & Plan:  Seborrheic dermatitis Assessment & Plan: Exam today representative of mild case of seborrheic dermatitis.  Treat with ketoconazole 2% cream twice daily as needed x 4 weeks.  We discussed maintenance with Selsun Blue shampoo.  He will update if no improvement.  Orders: -     Ketoconazole; Apply 1 Application topically 2 (two) times daily as needed for irritation.  Dispense: 30 g; Refill: 0  Chronic migraine without aura without status migrainosus, not intractable -     SUMAtriptan Succinate; Take 1 tablet by mouth at migraine onset, may repeat with additional tablet in 2 hours if headache  persists or recurs.  Dispense: 10 tablet; Refill: 0  Screening for colon cancer -     Ambulatory referral to Gastroenterology        Doreene Nest, NP

## 2023-06-18 ENCOUNTER — Ambulatory Visit (INDEPENDENT_AMBULATORY_CARE_PROVIDER_SITE_OTHER): Payer: BC Managed Care – PPO | Admitting: Primary Care

## 2023-06-18 ENCOUNTER — Encounter: Payer: Self-pay | Admitting: Primary Care

## 2023-06-18 VITALS — BP 108/76 | HR 76 | Temp 97.6°F | Ht 70.0 in | Wt 150.0 lb

## 2023-06-18 DIAGNOSIS — G43709 Chronic migraine without aura, not intractable, without status migrainosus: Secondary | ICD-10-CM

## 2023-06-18 DIAGNOSIS — F411 Generalized anxiety disorder: Secondary | ICD-10-CM | POA: Diagnosis not present

## 2023-06-18 DIAGNOSIS — Z1211 Encounter for screening for malignant neoplasm of colon: Secondary | ICD-10-CM

## 2023-06-18 DIAGNOSIS — E785 Hyperlipidemia, unspecified: Secondary | ICD-10-CM

## 2023-06-18 DIAGNOSIS — Z0000 Encounter for general adult medical examination without abnormal findings: Secondary | ICD-10-CM | POA: Diagnosis not present

## 2023-06-18 DIAGNOSIS — K219 Gastro-esophageal reflux disease without esophagitis: Secondary | ICD-10-CM | POA: Insufficient documentation

## 2023-06-18 DIAGNOSIS — L219 Seborrheic dermatitis, unspecified: Secondary | ICD-10-CM | POA: Diagnosis not present

## 2023-06-18 LAB — CBC
HCT: 43.6 % (ref 39.0–52.0)
Hemoglobin: 14.2 g/dL (ref 13.0–17.0)
MCHC: 32.4 g/dL (ref 30.0–36.0)
MCV: 93.9 fL (ref 78.0–100.0)
Platelets: 279 10*3/uL (ref 150.0–400.0)
RBC: 4.65 Mil/uL (ref 4.22–5.81)
RDW: 13.8 % (ref 11.5–15.5)
WBC: 6.6 10*3/uL (ref 4.0–10.5)

## 2023-06-18 LAB — COMPREHENSIVE METABOLIC PANEL
ALT: 34 U/L (ref 0–53)
AST: 23 U/L (ref 0–37)
Albumin: 4.2 g/dL (ref 3.5–5.2)
Alkaline Phosphatase: 55 U/L (ref 39–117)
BUN: 9 mg/dL (ref 6–23)
CO2: 26 meq/L (ref 19–32)
Calcium: 9.2 mg/dL (ref 8.4–10.5)
Chloride: 106 meq/L (ref 96–112)
Creatinine, Ser: 0.7 mg/dL (ref 0.40–1.50)
GFR: 111.01 mL/min (ref >60.00)
Glucose, Bld: 102 mg/dL — ABNORMAL HIGH (ref 70–99)
Potassium: 3.9 meq/L (ref 3.5–5.1)
Sodium: 137 meq/L (ref 135–145)
Total Bilirubin: 0.5 mg/dL (ref 0.2–1.2)
Total Protein: 6 g/dL (ref 6.0–8.3)

## 2023-06-18 LAB — LIPID PANEL
Cholesterol: 188 mg/dL (ref 0–200)
HDL: 38.1 mg/dL — ABNORMAL LOW (ref >39.00)
LDL Cholesterol: 134 mg/dL — ABNORMAL HIGH (ref 0–99)
NonHDL: 149.84
Total CHOL/HDL Ratio: 5
Triglycerides: 78 mg/dL (ref 0.0–149.0)
VLDL: 15.6 mg/dL (ref 0.0–40.0)

## 2023-06-18 MED ORDER — ONDANSETRON 4 MG PO TBDP: 4.0000 mg | ORAL_TABLET | Freq: Three times a day (TID) | ORAL | 0 refills | Status: AC | PRN

## 2023-06-18 NOTE — Assessment & Plan Note (Signed)
Uncontrolled.  Discussed to resume famotidine 20 mg daily as this is effective. He will update.  Discussed triggers for GERD.

## 2023-06-18 NOTE — Assessment & Plan Note (Signed)
Immunizations UTD. Declines influenza vaccine.  Colonoscopy due, new referral placed to GI  Discussed the importance of a healthy diet and regular exercise in order for weight loss, and to reduce the risk of further co-morbidity.  Exam stable. Labs pending.  Follow up in 1 year for repeat physical.

## 2023-06-18 NOTE — Patient Instructions (Signed)
Stop by the lab prior to leaving today. I will notify you of your results once received.   You will either be contacted via phone regarding your referral to GI for the colonoscopy, or you may receive a letter on your MyChart portal from our referral team with instructions for scheduling an appointment. Please let us know if you have not been contacted by anyone within two weeks.  It was a pleasure to see you today!   

## 2023-06-18 NOTE — Assessment & Plan Note (Signed)
Resolved.  Continue ketoconazole 2% PRN

## 2023-06-18 NOTE — Progress Notes (Signed)
Subjective:    Patient ID: Nathan Mccall, male    DOB: October 17, 1975, 47 y.o.   MRN: [id number]  HPI  Nathan Mccall is a very pleasant 47 y.o. male who presents today for complete physical and follow up of chronic conditions.  He would also like to discuss chronic GERD. Over the last few years his symptoms have improved until recently. Symptoms include esophageal burning to the chest and throat. He is experiencing symptoms mostly at night, is sleeping in the recliner sometimes due to symptoms. He's been taking famotidine 20 mg on occasion with improvement in symptoms.   Immunizations: -Tetanus: Completed in 2019 -Influenza: Declines influenza vaccine.   Diet: Fair diet.  Exercise: No regular exercise.  Eye exam: Completed 2 years  Dental exam: Completed 1 year ago  Colonoscopy: Never completed  BP Readings from Last 3 Encounters:  06/18/23 108/76  04/10/23 110/62  06/13/22 110/66        Review of Systems  Constitutional:  Negative for unexpected weight change.  HENT:  Negative for rhinorrhea.   Respiratory:  Negative for cough and shortness of breath.   Cardiovascular:  Negative for chest pain.  Gastrointestinal:  Negative for constipation and diarrhea.       GERD symptoms.  Genitourinary:  Negative for difficulty urinating.  Musculoskeletal:  Negative for arthralgias and myalgias.  Skin:  Negative for rash.  Allergic/Immunologic: Negative for environmental allergies.  Neurological:  Negative for dizziness, numbness and headaches.  Psychiatric/Behavioral:  The patient is not nervous/anxious.          Past Medical History:  Diagnosis Date   GERD (gastroesophageal reflux disease)    Heart murmur     Social History   Socioeconomic History   Marital status: Married    Spouse name: Not on file   Number of children: Not on file   Years of education: Not on file   Highest education level: Not on file  Occupational History   Not on file  Tobacco Use    Smoking status: Former   Smokeless tobacco: Never  Substance and Sexual Activity   Alcohol use: Yes    Alcohol/week: 6.0 standard drinks of alcohol    Types: 6 Standard drinks or equivalent per week   Drug use: No   Sexual activity: Yes  Other Topics Concern   Not on file  Social History Narrative   Not on file   Social Determinants of Health   Financial Resource Strain: Not on file  Food Insecurity: Not on file  Transportation Needs: Not on file  Physical Activity: Not on file  Stress: Not on file  Social Connections: Not on file  Intimate Partner Violence: Not on file    Past Surgical History:  Procedure Laterality Date   APPENDECTOMY  1987   TONSILLECTOMY  1995    Family History  Problem Relation Age of Onset   Diabetes Mother    Depression Mother    Breast cancer Maternal Grandmother    Diabetes Maternal Grandfather    Lung cancer Paternal Grandmother     No Known Allergies  Current Outpatient Medications on File Prior to Visit  Medication Sig Dispense Refill   escitalopram (LEXAPRO) 10 MG tablet Take 1 tablet (10 mg total) by mouth daily. For anxiety. 90 tablet 3   ketoconazole (NIZORAL) 2 % cream Apply 1 Application topically 2 (two) times daily as needed for irritation. 30 g 0   SUMAtriptan (IMITREX) 50 MG tablet Take 1 tablet by mouth  at migraine onset, may repeat with additional tablet in 2 hours if headache persists or recurs. 10 tablet 0   scopolamine (TRANSDERM-SCOP) 1 MG/3DAYS Apply 1 patch to a hairless area of your body 4 hours prior to trip start.  Replace patch every 3 days. (Patient not taking: Reported on 06/18/2023) 10 patch 0   No current facility-administered medications on file prior to visit.    BP 108/76   Pulse 76   Temp 97.6 F (36.4 C) (Temporal)   Ht 5\' 10"  (1.778 m)   Wt 150 lb (68 kg)   SpO2 98%   BMI 21.52 kg/m  Objective:   Physical Exam HENT:     Right Ear: Tympanic membrane and ear canal normal.     Left Ear: Tympanic  membrane and ear canal normal.  Eyes:     Pupils: Pupils are equal, round, and reactive to light.  Cardiovascular:     Rate and Rhythm: Normal rate and regular rhythm.  Pulmonary:     Effort: Pulmonary effort is normal.     Breath sounds: Normal breath sounds.  Abdominal:     General: Bowel sounds are normal.     Palpations: Abdomen is soft.     Tenderness: There is no abdominal tenderness.  Musculoskeletal:        General: Normal range of motion.     Cervical back: Neck supple.  Skin:    General: Skin is warm and dry.  Neurological:     Mental Status: He is alert and oriented to person, place, and time.     Cranial Nerves: No cranial nerve deficit.     Deep Tendon Reflexes:     Reflex Scores:      Patellar reflexes are 2+ on the right side and 2+ on the left side. Psychiatric:        Mood and Affect: Mood normal.           Assessment & Plan:  Preventative health care Assessment & Plan: Immunizations UTD. Declines influenza vaccine.  Colonoscopy due, new referral placed to GI  Discussed the importance of a healthy diet and regular exercise in order for weight loss, and to reduce the risk of further co-morbidity.  Exam stable. Labs pending.  Follow up in 1 year for repeat physical.    Screening for colon cancer -     Ambulatory referral to Gastroenterology  GAD (generalized anxiety disorder) Assessment & Plan: Controlled and wanting to wean off.  Reduce Lexapro to 5 mg daily x 2 weeks, then stop. He will update if he decides to resume.     Seborrheic dermatitis Assessment & Plan: Resolved.  Continue ketoconazole 2% PRN   Chronic migraine without aura without status migrainosus, not intractable Assessment & Plan: Overall controlled, with recent bad attack with nausea and vomiting.  Continue sumatriptan 50 mg PRN. Add Zofran ODT 4 mg PRN for nausea.   Orders: -     Ondansetron; Take 1 tablet (4 mg total) by mouth every 8 (eight) hours as needed  for nausea or vomiting.  Dispense: 20 tablet; Refill: 0  Hyperlipidemia, unspecified hyperlipidemia type Assessment & Plan: Repeat lipid panel pending.  Discussed the importance of a healthy diet and regular exercise in order for weight loss, and to reduce the risk of further co-morbidity.   Orders: -     Lipid panel -     CBC -     Comprehensive metabolic panel  Gastroesophageal reflux disease, unspecified whether esophagitis present Assessment &  Plan: Uncontrolled.  Discussed to resume famotidine 20 mg daily as this is effective. He will update.  Discussed triggers for GERD.         Doreene Nest, NP

## 2023-06-18 NOTE — Assessment & Plan Note (Signed)
Repeat lipid panel pending.  Discussed the importance of a healthy diet and regular exercise in order for weight loss, and to reduce the risk of further co-morbidity.  

## 2023-06-18 NOTE — Assessment & Plan Note (Signed)
Overall controlled, with recent bad attack with nausea and vomiting.  Continue sumatriptan 50 mg PRN. Add Zofran ODT 4 mg PRN for nausea.

## 2023-06-18 NOTE — Assessment & Plan Note (Signed)
Controlled and wanting to wean off.  Reduce Lexapro to 5 mg daily x 2 weeks, then stop. He will update if he decides to resume.

## 2023-06-24 ENCOUNTER — Encounter: Payer: Self-pay | Admitting: *Deleted

## 2023-07-04 DIAGNOSIS — J209 Acute bronchitis, unspecified: Secondary | ICD-10-CM | POA: Diagnosis not present

## 2023-07-10 ENCOUNTER — Other Ambulatory Visit: Payer: Self-pay | Admitting: Primary Care

## 2023-07-10 DIAGNOSIS — F411 Generalized anxiety disorder: Secondary | ICD-10-CM

## 2023-12-11 DIAGNOSIS — R051 Acute cough: Secondary | ICD-10-CM | POA: Diagnosis not present

## 2023-12-11 DIAGNOSIS — J209 Acute bronchitis, unspecified: Secondary | ICD-10-CM | POA: Diagnosis not present

## 2024-04-18 ENCOUNTER — Other Ambulatory Visit: Payer: Self-pay | Admitting: Primary Care

## 2024-04-18 DIAGNOSIS — F411 Generalized anxiety disorder: Secondary | ICD-10-CM

## 2024-04-18 NOTE — Telephone Encounter (Signed)
Patient is due for CPE/follow up in early November, this will be required prior to any further refills.  Please schedule, thank you!

## 2024-05-05 ENCOUNTER — Ambulatory Visit: Admitting: Primary Care

## 2024-05-05 ENCOUNTER — Encounter: Payer: Self-pay | Admitting: Primary Care

## 2024-05-05 VITALS — BP 120/76 | HR 77 | Temp 98.0°F | Ht 70.0 in | Wt 147.1 lb

## 2024-05-05 DIAGNOSIS — F411 Generalized anxiety disorder: Secondary | ICD-10-CM

## 2024-05-05 MED ORDER — ESCITALOPRAM OXALATE 20 MG PO TABS: 20.0000 mg | ORAL_TABLET | Freq: Every day | ORAL | 0 refills | Status: DC

## 2024-05-05 NOTE — Patient Instructions (Signed)
 We increased your dose of Lexapro  to 20 mg daily.  I sent a new prescription to your pharmacy.  We will see you in November as scheduled.  It was a pleasure to see you today!

## 2024-05-05 NOTE — Assessment & Plan Note (Signed)
 Deteriorated.  Increase Lexapro  to 20 mg daily.  New Rx sent to pharmacy .  Follow-up in November as scheduled.

## 2024-05-05 NOTE — Progress Notes (Signed)
 Subjective:    Patient ID: Nathan Mccall, male    DOB: [birth year], 48 y.o.   MRN: [id number]  Nathan Mccall is a very pleasant 48 y.o. male with a history of GAD, migraines, GERD who presents today to discuss anxiety.  Currently managed on Lexapro  10 mg daily which he has been taking consistently since 2022. He questions if we could increase the dose of Lexapro  as he's going through a stressful divorce. Symptoms include feeling overwhelmed, worrying, shaking, feeling anxious. Symptoms began about 2- months ago.  Historically he has done well on Lexapro  10 mg daily.  He feels that his symptoms are situational.  He denies nausea, GI upset, SI/HI.     Review of Systems  Respiratory:  Negative for shortness of breath.   Cardiovascular:  Negative for chest pain.  Gastrointestinal:  Negative for nausea.  Neurological:  Negative for headaches.  Psychiatric/Behavioral:  Negative for sleep disturbance. The patient is nervous/anxious.          Past Medical History:  Diagnosis Date   GERD (gastroesophageal reflux disease)    Heart murmur     Social History   Socioeconomic History   Marital status: Married    Spouse name: Not on file   Number of children: Not on file   Years of education: Not on file   Highest education level: Not on file  Occupational History   Not on file  Tobacco Use   Smoking status: Former   Smokeless tobacco: Never  Substance and Sexual Activity   Alcohol use: Yes    Alcohol/week: 6.0 standard drinks of alcohol    Types: 6 Standard drinks or equivalent per week   Drug use: No   Sexual activity: Yes  Other Topics Concern   Not on file  Social History Narrative   Not on file   Social Drivers of Health   Financial Resource Strain: Not on file  Food Insecurity: Not on file  Transportation Needs: Not on file  Physical Activity: Not on file  Stress: Not on file  Social Connections: Not on file  Intimate Partner Violence: Not on file     Past Surgical History:  Procedure Laterality Date   APPENDECTOMY  1987   TONSILLECTOMY  1995    Family History  Problem Relation Age of Onset   Diabetes Mother    Depression Mother    Breast cancer Maternal Grandmother    Diabetes Maternal Grandfather    Lung cancer Paternal Grandmother     No Known Allergies  Current Outpatient Medications on File Prior to Visit  Medication Sig Dispense Refill   ondansetron  (ZOFRAN -ODT) 4 MG disintegrating tablet Take 1 tablet (4 mg total) by mouth every 8 (eight) hours as needed for nausea or vomiting. 20 tablet 0   SUMAtriptan  (IMITREX ) 50 MG tablet Take 1 tablet by mouth at migraine onset, may repeat with additional tablet in 2 hours if headache persists or recurs. 10 tablet 0   No current facility-administered medications on file prior to visit.    BP 120/76   Pulse 77   Temp 98 F (36.7 C) (Oral)   Ht 5' 10 (1.778 m)   Wt 147 lb 2 oz (66.7 kg)   SpO2 97%   BMI 21.11 kg/m  Objective:   Physical Exam Cardiovascular:     Rate and Rhythm: Normal rate and regular rhythm.  Pulmonary:     Effort: Pulmonary effort is normal.     Breath sounds: Normal breath  sounds.  Musculoskeletal:     Cervical back: Neck supple.  Skin:    General: Skin is warm and dry.  Neurological:     Mental Status: He is alert and oriented to person, place, and time.  Psychiatric:        Mood and Affect: Mood normal.     Physical Exam        Assessment & Plan:  GAD (generalized anxiety disorder) Assessment & Plan: Deteriorated.  Increase Lexapro  to 20 mg daily.  New Rx sent to pharmacy .  Follow-up in November as scheduled.  Orders: -     Escitalopram  Oxalate; Take 1 tablet (20 mg total) by mouth daily. For anxiety  Dispense: 90 tablet; Refill: 0    Assessment and Plan Assessment & Plan         Nathan MARLA Gaskins, NP    History of Present Illness

## 2024-06-05 DIAGNOSIS — R059 Cough, unspecified: Secondary | ICD-10-CM | POA: Diagnosis not present

## 2024-06-05 DIAGNOSIS — B9689 Other specified bacterial agents as the cause of diseases classified elsewhere: Secondary | ICD-10-CM | POA: Diagnosis not present

## 2024-06-05 DIAGNOSIS — R0981 Nasal congestion: Secondary | ICD-10-CM | POA: Diagnosis not present

## 2024-06-05 DIAGNOSIS — J069 Acute upper respiratory infection, unspecified: Secondary | ICD-10-CM | POA: Diagnosis not present

## 2024-06-23 ENCOUNTER — Ambulatory Visit (INDEPENDENT_AMBULATORY_CARE_PROVIDER_SITE_OTHER): Admitting: Primary Care

## 2024-06-23 ENCOUNTER — Encounter: Payer: Self-pay | Admitting: Primary Care

## 2024-06-23 ENCOUNTER — Ambulatory Visit: Payer: Self-pay | Admitting: Primary Care

## 2024-06-23 VITALS — BP 118/74 | HR 64 | Temp 97.9°F | Ht 68.75 in | Wt 143.4 lb

## 2024-06-23 DIAGNOSIS — E785 Hyperlipidemia, unspecified: Secondary | ICD-10-CM

## 2024-06-23 DIAGNOSIS — K219 Gastro-esophageal reflux disease without esophagitis: Secondary | ICD-10-CM

## 2024-06-23 DIAGNOSIS — G43709 Chronic migraine without aura, not intractable, without status migrainosus: Secondary | ICD-10-CM

## 2024-06-23 DIAGNOSIS — Z1211 Encounter for screening for malignant neoplasm of colon: Secondary | ICD-10-CM

## 2024-06-23 DIAGNOSIS — F411 Generalized anxiety disorder: Secondary | ICD-10-CM

## 2024-06-23 DIAGNOSIS — Z0000 Encounter for general adult medical examination without abnormal findings: Secondary | ICD-10-CM | POA: Diagnosis not present

## 2024-06-23 LAB — LIPID PANEL
Cholesterol: 196 mg/dL (ref 0–200)
HDL: 36.3 mg/dL — ABNORMAL LOW (ref >39.00)
LDL Cholesterol: 142 mg/dL — ABNORMAL HIGH (ref 0–99)
NonHDL: 159.77
Total CHOL/HDL Ratio: 5
Triglycerides: 87 mg/dL (ref 0.0–149.0)
VLDL: 17.4 mg/dL (ref 0.0–40.0)

## 2024-06-23 LAB — COMPREHENSIVE METABOLIC PANEL WITH GFR
ALT: 22 U/L (ref 0–53)
AST: 15 U/L (ref 0–37)
Albumin: 4.3 g/dL (ref 3.5–5.2)
Alkaline Phosphatase: 70 U/L (ref 39–117)
BUN: 9 mg/dL (ref 6–23)
CO2: 30 meq/L (ref 19–32)
Calcium: 9.4 mg/dL (ref 8.4–10.5)
Chloride: 104 meq/L (ref 96–112)
Creatinine, Ser: 0.78 mg/dL (ref 0.40–1.50)
GFR: 106.67 mL/min (ref >60.00)
Glucose, Bld: 97 mg/dL (ref 70–99)
Potassium: 4.7 meq/L (ref 3.5–5.1)
Sodium: 140 meq/L (ref 135–145)
Total Bilirubin: 0.3 mg/dL (ref 0.2–1.2)
Total Protein: 6.4 g/dL (ref 6.0–8.3)

## 2024-06-23 LAB — HEMOGLOBIN A1C: Hgb A1c MFr Bld: 5.5 % (ref 4.6–6.5)

## 2024-06-23 NOTE — Assessment & Plan Note (Signed)
 Repeat lipid panel pending.

## 2024-06-23 NOTE — Assessment & Plan Note (Signed)
 Declines flu vaccine. Colonoscopy due, referral placed to GI  Discussed the importance of a healthy diet and regular exercise in order for weight loss, and to reduce the risk of further co-morbidity.  Exam stable. Labs pending.  Follow up in 1 year for repeat physical.

## 2024-06-23 NOTE — Assessment & Plan Note (Addendum)
 Controlled overall except for headaches.   Discussed options/causes including Lexapro  side effects, stress, chronic neck pain. He declines treatment for now. He will follow up with the chiropractor.   Continue sumatriptan  50 mg PRN and Zofran  4 mg as needed.

## 2024-06-23 NOTE — Patient Instructions (Signed)
 Stop by the lab prior to leaving today. I will notify you of your results once received.   You will receive a phone call regarding the colonoscopy.  It was a pleasure to see you today!

## 2024-06-23 NOTE — Assessment & Plan Note (Signed)
 Improved  Continue Lexapro  20 mg daily. We also discussed that Lexapro  could be causing his increased headaches.  Continue Lexapro  at the current dose for now.  He will notify if headaches do not improve.

## 2024-06-23 NOTE — Assessment & Plan Note (Signed)
 Controlled.  No concerns today.

## 2024-06-23 NOTE — Progress Notes (Signed)
 Subjective:    Patient ID: Nathan Mccall, male    DOB: July 24, 1976, 48 y.o.   MRN: [id number]  Nathan Mccall is a very pleasant 48 y.o. male who presents today for complete physical and follow up of chronic conditions.  Immunizations: -Tetanus: Completed in 2019 -Influenza: Declines influenza vaccine.   Diet: Fair diet.  Exercise: Regular exercise 3-4 days weekly   Eye exam: Completes annually  Dental exam: Completes semi-annually    Colonoscopy: Never completed    BP Readings from Last 3 Encounters:  06/23/24 118/74  05/05/24 120/76  06/18/23 108/76        Review of Systems  Constitutional:  Negative for unexpected weight change.  HENT:  Negative for rhinorrhea.   Respiratory:  Negative for cough and shortness of breath.   Cardiovascular:  Negative for chest pain.  Gastrointestinal:  Negative for constipation and diarrhea.  Genitourinary:  Negative for difficulty urinating.  Musculoskeletal:  Negative for arthralgias and myalgias.  Skin:  Negative for rash.  Allergic/Immunologic: Negative for environmental allergies.  Neurological:  Positive for headaches. Negative for dizziness.       Headaches 1-2 days weekly. Taking Tylenol    Psychiatric/Behavioral:  The patient is not nervous/anxious.          Past Medical History:  Diagnosis Date   GERD (gastroesophageal reflux disease)    Heart murmur    Tendonitis 09/02/2019    Social History   Socioeconomic History   Marital status: Married    Spouse name: Not on file   Number of children: Not on file   Years of education: Not on file   Highest education level: Not on file  Occupational History   Not on file  Tobacco Use   Smoking status: Former   Smokeless tobacco: Never  Substance and Sexual Activity   Alcohol use: Yes    Alcohol/week: 6.0 standard drinks of alcohol    Types: 6 Standard drinks or equivalent per week   Drug use: No   Sexual activity: Yes  Other Topics Concern   Not on file   Social History Narrative   Not on file   Social Drivers of Health   Financial Resource Strain: Not on file  Food Insecurity: Not on file  Transportation Needs: Not on file  Physical Activity: Not on file  Stress: Not on file  Social Connections: Not on file  Intimate Partner Violence: Not on file    Past Surgical History:  Procedure Laterality Date   APPENDECTOMY  1987   TONSILLECTOMY  1995    Family History  Problem Relation Age of Onset   Diabetes Mother    Depression Mother    Breast cancer Maternal Grandmother    Diabetes Maternal Grandfather    Lung cancer Paternal Grandmother     No Known Allergies  Current Outpatient Medications on File Prior to Visit  Medication Sig Dispense Refill   escitalopram  (LEXAPRO ) 20 MG tablet Take 1 tablet (20 mg total) by mouth daily. For anxiety 90 tablet 0   ondansetron  (ZOFRAN -ODT) 4 MG disintegrating tablet Take 1 tablet (4 mg total) by mouth every 8 (eight) hours as needed for nausea or vomiting. 20 tablet 0   SUMAtriptan  (IMITREX ) 50 MG tablet Take 1 tablet by mouth at migraine onset, may repeat with additional tablet in 2 hours if headache persists or recurs. 10 tablet 0   No current facility-administered medications on file prior to visit.    BP 118/74   Pulse 64  Temp 97.9 F (36.6 C) (Oral)   Ht 5' 8.75 (1.746 m)   Wt 143 lb 6 oz (65 kg)   SpO2 94%   BMI 21.33 kg/m  Objective:   Physical Exam HENT:     Right Ear: Tympanic membrane and ear canal normal.     Left Ear: Tympanic membrane and ear canal normal.  Eyes:     Pupils: Pupils are equal, round, and reactive to light.  Cardiovascular:     Rate and Rhythm: Normal rate and regular rhythm.  Pulmonary:     Effort: Pulmonary effort is normal.     Breath sounds: Normal breath sounds.  Abdominal:     General: Bowel sounds are normal.     Palpations: Abdomen is soft.     Tenderness: There is no abdominal tenderness.  Musculoskeletal:        General: Normal  range of motion.     Cervical back: Neck supple.  Skin:    General: Skin is warm and dry.  Neurological:     Mental Status: He is alert and oriented to person, place, and time.     Cranial Nerves: No cranial nerve deficit.     Deep Tendon Reflexes:     Reflex Scores:      Patellar reflexes are 2+ on the right side and 2+ on the left side. Psychiatric:        Mood and Affect: Mood normal.     Physical Exam        Assessment & Plan:  Preventative health care Assessment & Plan: Declines flu vaccine. Colonoscopy due, referral placed to GI  Discussed the importance of a healthy diet and regular exercise in order for weight loss, and to reduce the risk of further co-morbidity.  Exam stable. Labs pending.  Follow up in 1 year for repeat physical.    Chronic migraine without aura without status migrainosus, not intractable Assessment & Plan: Controlled overall except for headaches.   Discussed options/causes including Lexapro  side effects, stress, chronic neck pain. He declines treatment for now. He will follow up with the chiropractor.   Continue sumatriptan  50 mg PRN and Zofran  4 mg as needed.    Gastroesophageal reflux disease, unspecified whether esophagitis present Assessment & Plan: Controlled.  No concerns today.   Hyperlipidemia, unspecified hyperlipidemia type Assessment & Plan: Repeat lipid panel pending  Orders: -     Lipid panel -     Comprehensive metabolic panel with GFR -     Hemoglobin A1c  GAD (generalized anxiety disorder) Assessment & Plan: Improved  Continue Lexapro  20 mg daily. We also discussed that Lexapro  could be causing his increased headaches.  Continue Lexapro  at the current dose for now.  He will notify if headaches do not improve.   Screening for colon cancer -     Ambulatory referral to Gastroenterology    Assessment and Plan Assessment & Plan         Nathan MARLA Gaskins, NP     History of Present  Illness

## 2024-07-24 ENCOUNTER — Other Ambulatory Visit: Payer: Self-pay | Admitting: Primary Care

## 2024-07-24 DIAGNOSIS — F411 Generalized anxiety disorder: Secondary | ICD-10-CM

## 2024-08-10 ENCOUNTER — Other Ambulatory Visit: Payer: Self-pay | Admitting: *Deleted

## 2024-08-10 DIAGNOSIS — F411 Generalized anxiety disorder: Secondary | ICD-10-CM

## 2024-08-10 MED ORDER — ESCITALOPRAM OXALATE 20 MG PO TABS: 20.0000 mg | ORAL_TABLET | Freq: Every day | ORAL | 2 refills | Status: AC
